# Patient Record
Sex: Female | Born: 1950 | Race: Black or African American | Hispanic: No | Smoking: Never smoker
Health system: Southern US, Community
[De-identification: ages and names within clinical notes are randomized; demographics above are authoritative.]

## PROBLEM LIST (undated history)

## (undated) DIAGNOSIS — M1990 Unspecified osteoarthritis, unspecified site: Secondary | ICD-10-CM

## (undated) DIAGNOSIS — I82409 Acute embolism and thrombosis of unspecified deep veins of unspecified lower extremity: Secondary | ICD-10-CM

## (undated) DIAGNOSIS — K429 Umbilical hernia without obstruction or gangrene: Secondary | ICD-10-CM

## (undated) DIAGNOSIS — H269 Unspecified cataract: Secondary | ICD-10-CM

## (undated) HISTORY — DX: Unspecified osteoarthritis, unspecified site: M19.90

## (undated) HISTORY — DX: Acute embolism and thrombosis of unspecified deep veins of unspecified lower extremity: I82.409

## (undated) HISTORY — DX: Umbilical hernia without obstruction or gangrene: K42.9

## (undated) HISTORY — DX: Unspecified cataract: H26.9

## (undated) HISTORY — PX: NO PAST SURGERIES: SHX2092

---

## 1998-04-01 ENCOUNTER — Encounter: Admission: RE | Admit: 1998-04-01 | Discharge: 1998-04-01 | Payer: Self-pay | Admitting: Family Medicine

## 1998-08-17 ENCOUNTER — Encounter: Admission: RE | Admit: 1998-08-17 | Discharge: 1998-08-17 | Payer: Self-pay | Admitting: Family Medicine

## 1998-11-04 ENCOUNTER — Encounter: Admission: RE | Admit: 1998-11-04 | Discharge: 1998-11-04 | Payer: Self-pay | Admitting: Family Medicine

## 1998-11-21 ENCOUNTER — Encounter: Admission: RE | Admit: 1998-11-21 | Discharge: 1998-11-21 | Payer: Self-pay | Admitting: Sports Medicine

## 1999-01-24 ENCOUNTER — Encounter: Admission: RE | Admit: 1999-01-24 | Discharge: 1999-01-24 | Payer: Self-pay | Admitting: Family Medicine

## 2001-02-24 ENCOUNTER — Encounter: Admission: RE | Admit: 2001-02-24 | Discharge: 2001-02-24 | Payer: Self-pay | Admitting: Family Medicine

## 2001-02-26 ENCOUNTER — Encounter: Admission: RE | Admit: 2001-02-26 | Discharge: 2001-02-26 | Payer: Self-pay | Admitting: Family Medicine

## 2003-08-20 ENCOUNTER — Emergency Department (HOSPITAL_COMMUNITY): Admission: EM | Admit: 2003-08-20 | Discharge: 2003-08-20 | Payer: Self-pay | Admitting: Emergency Medicine

## 2003-08-26 ENCOUNTER — Encounter: Admission: RE | Admit: 2003-08-26 | Discharge: 2003-09-01 | Payer: Self-pay | Admitting: Emergency Medicine

## 2012-07-23 ENCOUNTER — Encounter: Payer: Self-pay | Admitting: *Deleted

## 2012-07-29 ENCOUNTER — Telehealth: Payer: Self-pay | Admitting: *Deleted

## 2012-07-29 NOTE — Telephone Encounter (Signed)
Telephoned patient at home # and left message to call [facility] to reschedule appointment

## 2012-08-05 ENCOUNTER — Ambulatory Visit (HOSPITAL_COMMUNITY): Payer: Self-pay

## 2012-09-02 ENCOUNTER — Other Ambulatory Visit: Payer: Self-pay | Admitting: Obstetrics and Gynecology

## 2012-09-02 DIAGNOSIS — Z1231 Encounter for screening mammogram for malignant neoplasm of breast: Secondary | ICD-10-CM

## 2012-09-03 ENCOUNTER — Encounter (HOSPITAL_COMMUNITY): Payer: Self-pay | Admitting: *Deleted

## 2012-09-09 ENCOUNTER — Other Ambulatory Visit: Payer: Self-pay | Admitting: Obstetrics and Gynecology

## 2012-09-09 ENCOUNTER — Ambulatory Visit (HOSPITAL_COMMUNITY)
Admission: RE | Admit: 2012-09-09 | Discharge: 2012-09-09 | Disposition: A | Discharge status: Home / Self Care | Payer: Self-pay | Source: Ambulatory Visit | Attending: Obstetrics and Gynecology | Admitting: Obstetrics and Gynecology

## 2012-09-09 ENCOUNTER — Other Ambulatory Visit (HOSPITAL_COMMUNITY)
Admission: RE | Admit: 2012-09-09 | Discharge: 2012-09-09 | Disposition: A | Discharge status: Home / Self Care | Payer: Self-pay | Source: Ambulatory Visit | Attending: Obstetrics and Gynecology | Admitting: Obstetrics and Gynecology

## 2012-09-09 ENCOUNTER — Encounter (HOSPITAL_COMMUNITY): Payer: Self-pay

## 2012-09-09 DIAGNOSIS — Z01419 Encounter for gynecological examination (general) (routine) without abnormal findings: Secondary | ICD-10-CM

## 2012-09-09 DIAGNOSIS — Z1231 Encounter for screening mammogram for malignant neoplasm of breast: Secondary | ICD-10-CM

## 2012-09-22 NOTE — Patient Instructions (Signed)
Detailed notes scanned into EPIC under media. 

## 2012-09-22 NOTE — Progress Notes (Signed)
Detailed notes scanned into EPIC under media. 

## 2012-10-01 ENCOUNTER — Encounter (HOSPITAL_COMMUNITY): Payer: Self-pay | Admitting: *Deleted

## 2012-10-03 ENCOUNTER — Encounter (HOSPITAL_COMMUNITY): Payer: Self-pay | Admitting: *Deleted

## 2012-11-12 NOTE — Addendum Note (Signed)
Encounter addended by: Saintclair Halsted, RN on: 11/12/2012  2:44 PM<BR>     Documentation filed: Visit Diagnoses, Charges VN

## 2012-12-04 NOTE — Addendum Note (Signed)
Encounter addended by: Saintclair Halsted, RN on: 12/04/2012  3:23 PM<BR>     Documentation filed: Visit Diagnoses

## 2012-12-04 NOTE — Addendum Note (Signed)
Encounter addended by: Saintclair Halsted, RN on: 12/04/2012  4:03 PM<BR>     Documentation filed: Visit Diagnoses

## 2016-01-26 DIAGNOSIS — M4120 Other idiopathic scoliosis, site unspecified: Secondary | ICD-10-CM | POA: Diagnosis not present

## 2016-01-26 DIAGNOSIS — Z0000 Encounter for general adult medical examination without abnormal findings: Secondary | ICD-10-CM | POA: Diagnosis not present

## 2016-01-26 DIAGNOSIS — Z6823 Body mass index (BMI) 23.0-23.9, adult: Secondary | ICD-10-CM | POA: Diagnosis not present

## 2016-01-26 DIAGNOSIS — E782 Mixed hyperlipidemia: Secondary | ICD-10-CM | POA: Diagnosis not present

## 2016-02-07 ENCOUNTER — Emergency Department (HOSPITAL_COMMUNITY): Payer: Commercial Managed Care - HMO

## 2016-02-07 ENCOUNTER — Encounter (HOSPITAL_COMMUNITY): Payer: Self-pay | Admitting: Emergency Medicine

## 2016-02-07 ENCOUNTER — Emergency Department (HOSPITAL_COMMUNITY)
Admission: EM | Admit: 2016-02-07 | Discharge: 2016-02-07 | Disposition: A | Discharge status: Home / Self Care | Payer: Commercial Managed Care - HMO | Attending: Emergency Medicine | Admitting: Emergency Medicine

## 2016-02-07 DIAGNOSIS — Y998 Other external cause status: Secondary | ICD-10-CM | POA: Insufficient documentation

## 2016-02-07 DIAGNOSIS — Y9389 Activity, other specified: Secondary | ICD-10-CM | POA: Insufficient documentation

## 2016-02-07 DIAGNOSIS — M7581 Other shoulder lesions, right shoulder: Secondary | ICD-10-CM | POA: Diagnosis not present

## 2016-02-07 DIAGNOSIS — S4991XA Unspecified injury of right shoulder and upper arm, initial encounter: Secondary | ICD-10-CM | POA: Diagnosis present

## 2016-02-07 DIAGNOSIS — Y92480 Sidewalk as the place of occurrence of the external cause: Secondary | ICD-10-CM | POA: Diagnosis not present

## 2016-02-07 DIAGNOSIS — M25511 Pain in right shoulder: Secondary | ICD-10-CM | POA: Diagnosis not present

## 2016-02-07 DIAGNOSIS — W010XXA Fall on same level from slipping, tripping and stumbling without subsequent striking against object, initial encounter: Secondary | ICD-10-CM | POA: Insufficient documentation

## 2016-02-07 DIAGNOSIS — M779 Enthesopathy, unspecified: Secondary | ICD-10-CM

## 2016-02-07 MED ORDER — NAPROXEN 500 MG PO TABS: 500.0000 mg | ORAL_TABLET | Freq: Two times a day (BID) | ORAL | Status: DC

## 2016-02-07 NOTE — ED Provider Notes (Signed)
CSN: [id number]     Arrival date & time 02/07/16  0645 History   First MD Initiated Contact with Patient 02/07/16 [phone]     Chief Complaint  Patient presents with  . Fall  . Shoulder Pain      HPI  Patient here for evaluation of shoulder pain. She states that she has been doing some exercises sitting in her chair. She indicates this to me by lacing her fingers in front of her limping both hands above her head and lifting both hands up and down above her head. She states she is not doing this with any sort of weight or resistance or bands. When asked why she is doing that she states "I don't know". Also states she fell on Friday when tripping over a curb and caught herself on her hands. Claims right shoulder is painful onset and it "just drops" when she tries to lift it up. She puts 2 fingers on the front of her anterior glenohumeral joint and states it is painful. No pain posteriorly. No pain down the arm.  History reviewed. No pertinent past medical history. History reviewed. No pertinent past surgical history. No family history on file. Social History  Substance Use Topics  . Smoking status: Never Smoker   . Smokeless tobacco: None  . Alcohol Use: No   OB History    No data available     Review of Systems  Constitutional: Negative for fever, chills, diaphoresis, appetite change and fatigue.  HENT: Negative for mouth sores, sore throat and trouble swallowing.   Eyes: Negative for visual disturbance.  Respiratory: Negative for cough, chest tightness, shortness of breath and wheezing.   Cardiovascular: Negative for chest pain.  Gastrointestinal: Negative for nausea, vomiting, abdominal pain, diarrhea and abdominal distention.  Endocrine: Negative for polydipsia, polyphagia and polyuria.  Genitourinary: Negative for dysuria, frequency and hematuria.  Musculoskeletal: Positive for arthralgias. Negative for gait problem.  Skin: Negative for color change, pallor and rash.    Neurological: Negative for dizziness, syncope, light-headedness and headaches.  Hematological: Does not bruise/bleed easily.  Psychiatric/Behavioral: Negative for behavioral problems and confusion.      Allergies  Review of patient's allergies indicates no known allergies.  Home Medications   Prior to Admission medications   Medication Sig Start Date End Date Taking? Authorizing Provider  naproxen (NAPROSYN) 500 MG tablet Take 1 tablet (500 mg total) by mouth 2 (two) times daily. 02/07/16   Rolland Porter, MD   BP 137/75 mmHg  Pulse 65  Temp(Src) 97.7 F (36.5 C) (Oral)  Resp 18  Ht  (1.651 m)  Wt 139 lb (63.05 kg)  BMI 23.13 kg/m2  SpO2 100% Physical Exam  Constitutional: She is oriented to person, place, and time. She appears well-developed and well-nourished. No distress.  HENT:  Head: Normocephalic.  Eyes: Conjunctivae are normal. Pupils are equal, round, and reactive to light. No scleral icterus.  Neck: Normal range of motion. Neck supple. No thyromegaly present.  Cardiovascular: Normal rate and regular rhythm.  Exam reveals no gallop and no friction rub.   No murmur heard. Pulmonary/Chest: Effort normal and breath sounds normal. No respiratory distress. She has no wheezes. She has no rales.  Abdominal: Soft. Bowel sounds are normal. She exhibits no distension. There is no tenderness. There is no rebound.  Musculoskeletal:       Right shoulder: She exhibits decreased range of motion and tenderness. She exhibits no swelling, no effusion, no crepitus and no deformity.  Arms: Neurological: She is alert and oriented to person, place, and time.  Skin: Skin is warm and dry. No rash noted.  Psychiatric: She has a normal mood and affect. Her behavior is normal.    ED Course  Procedures (including critical care time) Labs Review Labs Reviewed - No data to display  Imaging Review Dg Shoulder Right  02/07/2016  CLINICAL DATA:  Ongoing right shoulder pain since falling  4 days ago. Initial encounter. EXAM: RIGHT SHOULDER - 2+ VIEW COMPARISON:  None. FINDINGS: The bones appear mildly demineralized. There is no evidence of acute fracture or dislocation. The subacromial space appears narrowed, especially on the Y-views, suspicious for a chronic rotator cuff tear. There are mild glenohumeral and acromioclavicular degenerative changes. There is prominent spurring at the humeral tuberosities and acromion. IMPRESSION: No acute osseous findings.  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 Electronically Signed   By: Carey BullocksWilliam  Veazey M.D.   On: 02/07/2016 07:39   I have personally reviewed and evaluated these images and lab results as part of my medical decision-making.   EKG Interpretation None      MDM   Final diagnoses:  Tendonitis  Rotator cuff tendinitis, right    No acute abdomen allergies on x-ray. Symptoms consistent with a rotator cuff tendinitis. Family avoiding of the inciting activities. Intermittent ice. Anti-inflammatory. Primary care follow-up as needed.    Rolland PorterMark Thayer Embleton, MD 02/07/16 [phone]

## 2016-02-07 NOTE — ED Notes (Signed)
Pt had fall on Friday, now c/o right shoulder pain. Tripped over sidewalk. Pt states it is still sore.

## 2016-02-07 NOTE — Discharge Instructions (Signed)
Avoid overhead exercise.  Ice intermittently to the right shoulder.  Naproxen as needed.

## 2016-03-19 DIAGNOSIS — M1990 Unspecified osteoarthritis, unspecified site: Secondary | ICD-10-CM | POA: Diagnosis not present

## 2016-05-01 DIAGNOSIS — H6123 Impacted cerumen, bilateral: Secondary | ICD-10-CM | POA: Diagnosis not present

## 2016-05-01 DIAGNOSIS — M19019 Primary osteoarthritis, unspecified shoulder: Secondary | ICD-10-CM | POA: Diagnosis not present

## 2016-05-01 DIAGNOSIS — S46009S Unspecified injury of muscle(s) and tendon(s) of the rotator cuff of unspecified shoulder, sequela: Secondary | ICD-10-CM | POA: Diagnosis not present

## 2016-06-18 ENCOUNTER — Other Ambulatory Visit: Payer: Self-pay | Admitting: Family Medicine

## 2016-06-18 ENCOUNTER — Encounter: Payer: Self-pay | Admitting: Family Medicine

## 2016-06-18 DIAGNOSIS — Z0000 Encounter for general adult medical examination without abnormal findings: Secondary | ICD-10-CM | POA: Diagnosis not present

## 2016-06-18 DIAGNOSIS — Z23 Encounter for immunization: Secondary | ICD-10-CM | POA: Diagnosis not present

## 2016-06-18 DIAGNOSIS — Z1231 Encounter for screening mammogram for malignant neoplasm of breast: Secondary | ICD-10-CM

## 2016-06-18 DIAGNOSIS — Z1211 Encounter for screening for malignant neoplasm of colon: Secondary | ICD-10-CM | POA: Diagnosis not present

## 2016-06-18 DIAGNOSIS — E2839 Other primary ovarian failure: Secondary | ICD-10-CM

## 2016-06-18 DIAGNOSIS — Z136 Encounter for screening for cardiovascular disorders: Secondary | ICD-10-CM | POA: Diagnosis not present

## 2016-07-17 ENCOUNTER — Ambulatory Visit: Payer: Commercial Managed Care - HMO

## 2016-07-17 ENCOUNTER — Other Ambulatory Visit: Payer: Commercial Managed Care - HMO

## 2016-07-31 ENCOUNTER — Ambulatory Visit
Admission: RE | Admit: 2016-07-31 | Discharge: 2016-07-31 | Disposition: A | Discharge status: Home / Self Care | Payer: Commercial Managed Care - HMO | Source: Ambulatory Visit | Attending: Family Medicine | Admitting: Family Medicine

## 2016-07-31 DIAGNOSIS — Z13820 Encounter for screening for osteoporosis: Secondary | ICD-10-CM | POA: Diagnosis not present

## 2016-07-31 DIAGNOSIS — Z780 Asymptomatic menopausal state: Secondary | ICD-10-CM | POA: Diagnosis not present

## 2016-07-31 DIAGNOSIS — Z1231 Encounter for screening mammogram for malignant neoplasm of breast: Secondary | ICD-10-CM | POA: Diagnosis not present

## 2016-07-31 DIAGNOSIS — E2839 Other primary ovarian failure: Secondary | ICD-10-CM

## 2016-08-02 ENCOUNTER — Other Ambulatory Visit: Payer: Self-pay | Admitting: Family Medicine

## 2016-08-02 DIAGNOSIS — R928 Other abnormal and inconclusive findings on diagnostic imaging of breast: Secondary | ICD-10-CM

## 2017-06-25 DIAGNOSIS — Z1211 Encounter for screening for malignant neoplasm of colon: Secondary | ICD-10-CM | POA: Diagnosis not present

## 2017-06-25 DIAGNOSIS — Z79899 Other long term (current) drug therapy: Secondary | ICD-10-CM | POA: Diagnosis not present

## 2017-06-25 DIAGNOSIS — Z0000 Encounter for general adult medical examination without abnormal findings: Secondary | ICD-10-CM | POA: Diagnosis not present

## 2017-06-25 DIAGNOSIS — Z6822 Body mass index (BMI) 22.0-22.9, adult: Secondary | ICD-10-CM | POA: Diagnosis not present

## 2017-06-25 DIAGNOSIS — Z23 Encounter for immunization: Secondary | ICD-10-CM | POA: Diagnosis not present

## 2017-06-27 ENCOUNTER — Ambulatory Visit: Payer: Commercial Managed Care - HMO

## 2017-06-27 ENCOUNTER — Ambulatory Visit
Admission: RE | Admit: 2017-06-27 | Discharge: 2017-06-27 | Disposition: A | Discharge status: Home / Self Care | Payer: Commercial Managed Care - HMO | Source: Ambulatory Visit | Attending: Family Medicine | Admitting: Family Medicine

## 2017-06-27 DIAGNOSIS — R928 Other abnormal and inconclusive findings on diagnostic imaging of breast: Secondary | ICD-10-CM

## 2017-07-31 ENCOUNTER — Encounter: Payer: Self-pay | Admitting: Family Medicine

## 2017-08-21 DIAGNOSIS — D72818 Other decreased white blood cell count: Secondary | ICD-10-CM | POA: Diagnosis not present

## 2017-08-22 DIAGNOSIS — Z23 Encounter for immunization: Secondary | ICD-10-CM | POA: Diagnosis not present

## 2017-08-22 DIAGNOSIS — Z6822 Body mass index (BMI) 22.0-22.9, adult: Secondary | ICD-10-CM | POA: Diagnosis not present

## 2017-08-22 DIAGNOSIS — D72818 Other decreased white blood cell count: Secondary | ICD-10-CM | POA: Diagnosis not present

## 2017-09-09 ENCOUNTER — Other Ambulatory Visit: Payer: Self-pay | Admitting: Family Medicine

## 2017-09-09 DIAGNOSIS — Z1231 Encounter for screening mammogram for malignant neoplasm of breast: Secondary | ICD-10-CM

## 2017-09-24 ENCOUNTER — Ambulatory Visit
Admission: RE | Admit: 2017-09-24 | Discharge: 2017-09-24 | Disposition: A | Discharge status: Home / Self Care | Payer: Commercial Managed Care - HMO | Source: Ambulatory Visit | Attending: Family Medicine | Admitting: Family Medicine

## 2017-09-24 DIAGNOSIS — Z1231 Encounter for screening mammogram for malignant neoplasm of breast: Secondary | ICD-10-CM

## 2017-12-24 DIAGNOSIS — Z23 Encounter for immunization: Secondary | ICD-10-CM | POA: Diagnosis not present

## 2018-07-01 ENCOUNTER — Other Ambulatory Visit: Payer: Self-pay | Admitting: Family Medicine

## 2018-07-01 DIAGNOSIS — Z0000 Encounter for general adult medical examination without abnormal findings: Secondary | ICD-10-CM | POA: Diagnosis not present

## 2018-07-01 DIAGNOSIS — R1900 Intra-abdominal and pelvic swelling, mass and lump, unspecified site: Secondary | ICD-10-CM | POA: Diagnosis not present

## 2018-07-01 DIAGNOSIS — Z113 Encounter for screening for infections with a predominantly sexual mode of transmission: Secondary | ICD-10-CM | POA: Diagnosis not present

## 2018-07-01 DIAGNOSIS — N760 Acute vaginitis: Secondary | ICD-10-CM | POA: Diagnosis not present

## 2018-07-01 DIAGNOSIS — Z01411 Encounter for gynecological examination (general) (routine) with abnormal findings: Secondary | ICD-10-CM | POA: Diagnosis not present

## 2018-07-01 DIAGNOSIS — Z118 Encounter for screening for other infectious and parasitic diseases: Secondary | ICD-10-CM | POA: Diagnosis not present

## 2018-07-01 DIAGNOSIS — Z01419 Encounter for gynecological examination (general) (routine) without abnormal findings: Secondary | ICD-10-CM | POA: Diagnosis not present

## 2018-07-01 DIAGNOSIS — Z1231 Encounter for screening mammogram for malignant neoplasm of breast: Secondary | ICD-10-CM

## 2018-07-01 DIAGNOSIS — Z6822 Body mass index (BMI) 22.0-22.9, adult: Secondary | ICD-10-CM | POA: Diagnosis not present

## 2018-07-01 DIAGNOSIS — R1903 Right lower quadrant abdominal swelling, mass and lump: Secondary | ICD-10-CM | POA: Diagnosis not present

## 2018-07-01 DIAGNOSIS — Z1151 Encounter for screening for human papillomavirus (HPV): Secondary | ICD-10-CM | POA: Diagnosis not present

## 2018-07-01 DIAGNOSIS — Z1211 Encounter for screening for malignant neoplasm of colon: Secondary | ICD-10-CM | POA: Diagnosis not present

## 2018-07-02 ENCOUNTER — Other Ambulatory Visit: Payer: Self-pay | Admitting: Family Medicine

## 2018-07-02 ENCOUNTER — Ambulatory Visit
Admission: RE | Admit: 2018-07-02 | Discharge: 2018-07-02 | Disposition: A | Discharge status: Home / Self Care | Payer: Commercial Managed Care - HMO | Source: Ambulatory Visit | Attending: Family Medicine | Admitting: Family Medicine

## 2018-07-02 DIAGNOSIS — K449 Diaphragmatic hernia without obstruction or gangrene: Secondary | ICD-10-CM | POA: Diagnosis not present

## 2018-07-02 DIAGNOSIS — R1900 Intra-abdominal and pelvic swelling, mass and lump, unspecified site: Secondary | ICD-10-CM

## 2018-07-23 DIAGNOSIS — K429 Umbilical hernia without obstruction or gangrene: Secondary | ICD-10-CM | POA: Diagnosis not present

## 2018-07-23 DIAGNOSIS — D72819 Decreased white blood cell count, unspecified: Secondary | ICD-10-CM | POA: Diagnosis not present

## 2018-07-23 DIAGNOSIS — Z6821 Body mass index (BMI) 21.0-21.9, adult: Secondary | ICD-10-CM | POA: Diagnosis not present

## 2018-07-23 DIAGNOSIS — R899 Unspecified abnormal finding in specimens from other organs, systems and tissues: Secondary | ICD-10-CM | POA: Diagnosis not present

## 2018-07-23 DIAGNOSIS — Z23 Encounter for immunization: Secondary | ICD-10-CM | POA: Diagnosis not present

## 2018-09-25 ENCOUNTER — Ambulatory Visit: Payer: Commercial Managed Care - HMO

## 2018-10-06 ENCOUNTER — Ambulatory Visit
Admission: RE | Admit: 2018-10-06 | Discharge: 2018-10-06 | Disposition: A | Discharge status: Home / Self Care | Payer: Medicare HMO | Source: Ambulatory Visit | Attending: Family Medicine | Admitting: Family Medicine

## 2018-10-06 DIAGNOSIS — D72818 Other decreased white blood cell count: Secondary | ICD-10-CM | POA: Diagnosis not present

## 2018-10-06 DIAGNOSIS — Z1231 Encounter for screening mammogram for malignant neoplasm of breast: Secondary | ICD-10-CM | POA: Diagnosis not present

## 2018-10-06 DIAGNOSIS — D72819 Decreased white blood cell count, unspecified: Secondary | ICD-10-CM | POA: Diagnosis not present

## 2018-10-07 ENCOUNTER — Ambulatory Visit: Payer: Medicare HMO

## 2018-11-05 ENCOUNTER — Ambulatory Visit: Payer: Medicare HMO

## 2019-07-07 DIAGNOSIS — D72818 Other decreased white blood cell count: Secondary | ICD-10-CM | POA: Diagnosis not present

## 2019-07-07 DIAGNOSIS — M1990 Unspecified osteoarthritis, unspecified site: Secondary | ICD-10-CM | POA: Diagnosis not present

## 2019-07-07 DIAGNOSIS — Z0000 Encounter for general adult medical examination without abnormal findings: Secondary | ICD-10-CM | POA: Diagnosis not present

## 2019-07-07 DIAGNOSIS — K429 Umbilical hernia without obstruction or gangrene: Secondary | ICD-10-CM | POA: Diagnosis not present

## 2019-07-13 DIAGNOSIS — D72818 Other decreased white blood cell count: Secondary | ICD-10-CM | POA: Diagnosis not present

## 2019-07-13 DIAGNOSIS — Z79899 Other long term (current) drug therapy: Secondary | ICD-10-CM | POA: Diagnosis not present

## 2019-08-07 DIAGNOSIS — H40023 Open angle with borderline findings, high risk, bilateral: Secondary | ICD-10-CM | POA: Diagnosis not present

## 2019-08-07 DIAGNOSIS — H5203 Hypermetropia, bilateral: Secondary | ICD-10-CM | POA: Diagnosis not present

## 2019-08-07 DIAGNOSIS — H2513 Age-related nuclear cataract, bilateral: Secondary | ICD-10-CM | POA: Diagnosis not present

## 2019-08-20 DIAGNOSIS — M19019 Primary osteoarthritis, unspecified shoulder: Secondary | ICD-10-CM | POA: Diagnosis not present

## 2019-08-20 DIAGNOSIS — D72819 Decreased white blood cell count, unspecified: Secondary | ICD-10-CM | POA: Diagnosis not present

## 2019-08-20 DIAGNOSIS — H269 Unspecified cataract: Secondary | ICD-10-CM | POA: Diagnosis not present

## 2019-08-25 DIAGNOSIS — Z23 Encounter for immunization: Secondary | ICD-10-CM | POA: Diagnosis not present

## 2019-08-28 ENCOUNTER — Other Ambulatory Visit: Payer: Self-pay | Admitting: Family Medicine

## 2019-08-28 DIAGNOSIS — Z1231 Encounter for screening mammogram for malignant neoplasm of breast: Secondary | ICD-10-CM

## 2019-09-28 ENCOUNTER — Other Ambulatory Visit: Payer: Self-pay | Admitting: *Deleted

## 2019-09-28 DIAGNOSIS — R197 Diarrhea, unspecified: Secondary | ICD-10-CM | POA: Diagnosis not present

## 2019-09-28 DIAGNOSIS — Z20822 Contact with and (suspected) exposure to covid-19: Secondary | ICD-10-CM

## 2019-09-29 DIAGNOSIS — Z719 Counseling, unspecified: Secondary | ICD-10-CM | POA: Diagnosis not present

## 2019-09-29 DIAGNOSIS — R197 Diarrhea, unspecified: Secondary | ICD-10-CM | POA: Diagnosis not present

## 2019-09-29 DIAGNOSIS — R600 Localized edema: Secondary | ICD-10-CM | POA: Diagnosis not present

## 2019-09-29 LAB — NOVEL CORONAVIRUS, NAA: SARS-CoV-2, NAA: NOT DETECTED

## 2019-10-05 ENCOUNTER — Encounter (HOSPITAL_COMMUNITY): Payer: Self-pay | Admitting: Emergency Medicine

## 2019-10-05 ENCOUNTER — Other Ambulatory Visit: Payer: Self-pay

## 2019-10-05 ENCOUNTER — Emergency Department (HOSPITAL_BASED_OUTPATIENT_CLINIC_OR_DEPARTMENT_OTHER): Payer: Medicare HMO

## 2019-10-05 ENCOUNTER — Inpatient Hospital Stay (HOSPITAL_COMMUNITY)
Admission: EM | Admit: 2019-10-05 | Discharge: 2019-10-07 | DRG: 301 | Disposition: A | Discharge status: Home / Self Care | Payer: Medicare HMO | Attending: Internal Medicine | Admitting: Internal Medicine

## 2019-10-05 ENCOUNTER — Emergency Department (HOSPITAL_COMMUNITY): Payer: Medicare HMO

## 2019-10-05 DIAGNOSIS — K0889 Other specified disorders of teeth and supporting structures: Secondary | ICD-10-CM | POA: Diagnosis present

## 2019-10-05 DIAGNOSIS — R609 Edema, unspecified: Secondary | ICD-10-CM | POA: Diagnosis not present

## 2019-10-05 DIAGNOSIS — R600 Localized edema: Secondary | ICD-10-CM | POA: Diagnosis not present

## 2019-10-05 DIAGNOSIS — I82442 Acute embolism and thrombosis of left tibial vein: Secondary | ICD-10-CM | POA: Diagnosis not present

## 2019-10-05 DIAGNOSIS — I82452 Acute embolism and thrombosis of left peroneal vein: Secondary | ICD-10-CM | POA: Diagnosis not present

## 2019-10-05 DIAGNOSIS — I82432 Acute embolism and thrombosis of left popliteal vein: Secondary | ICD-10-CM

## 2019-10-05 DIAGNOSIS — Z6821 Body mass index (BMI) 21.0-21.9, adult: Secondary | ICD-10-CM | POA: Diagnosis not present

## 2019-10-05 DIAGNOSIS — I82412 Acute embolism and thrombosis of left femoral vein: Secondary | ICD-10-CM | POA: Diagnosis not present

## 2019-10-05 DIAGNOSIS — I82409 Acute embolism and thrombosis of unspecified deep veins of unspecified lower extremity: Secondary | ICD-10-CM | POA: Diagnosis present

## 2019-10-05 DIAGNOSIS — R000 Tachycardia, unspecified: Secondary | ICD-10-CM | POA: Diagnosis not present

## 2019-10-05 DIAGNOSIS — Z03818 Encounter for observation for suspected exposure to other biological agents ruled out: Secondary | ICD-10-CM | POA: Diagnosis not present

## 2019-10-05 DIAGNOSIS — I824Y2 Acute embolism and thrombosis of unspecified deep veins of left proximal lower extremity: Secondary | ICD-10-CM | POA: Diagnosis not present

## 2019-10-05 DIAGNOSIS — Z20828 Contact with and (suspected) exposure to other viral communicable diseases: Secondary | ICD-10-CM | POA: Diagnosis present

## 2019-10-05 DIAGNOSIS — I82462 Acute embolism and thrombosis of left calf muscular vein: Secondary | ICD-10-CM | POA: Diagnosis not present

## 2019-10-05 DIAGNOSIS — I82402 Acute embolism and thrombosis of unspecified deep veins of left lower extremity: Secondary | ICD-10-CM | POA: Diagnosis not present

## 2019-10-05 DIAGNOSIS — M79605 Pain in left leg: Secondary | ICD-10-CM | POA: Diagnosis present

## 2019-10-05 DIAGNOSIS — E876 Hypokalemia: Secondary | ICD-10-CM | POA: Diagnosis present

## 2019-10-05 DIAGNOSIS — I82422 Acute embolism and thrombosis of left iliac vein: Principal | ICD-10-CM | POA: Diagnosis present

## 2019-10-05 DIAGNOSIS — R634 Abnormal weight loss: Secondary | ICD-10-CM | POA: Diagnosis present

## 2019-10-05 DIAGNOSIS — R001 Bradycardia, unspecified: Secondary | ICD-10-CM | POA: Diagnosis present

## 2019-10-05 DIAGNOSIS — K439 Ventral hernia without obstruction or gangrene: Secondary | ICD-10-CM | POA: Diagnosis not present

## 2019-10-05 DIAGNOSIS — R197 Diarrhea, unspecified: Secondary | ICD-10-CM | POA: Diagnosis not present

## 2019-10-05 DIAGNOSIS — Z719 Counseling, unspecified: Secondary | ICD-10-CM | POA: Diagnosis not present

## 2019-10-05 LAB — CBC WITH DIFFERENTIAL/PLATELET
Abs Immature Granulocytes: 0.03 10*3/uL (ref 0.00–0.07)
Basophils Absolute: 0 10*3/uL (ref 0.0–0.1)
Basophils Relative: 1 %
Eosinophils Absolute: 0.3 10*3/uL (ref 0.0–0.5)
Eosinophils Relative: 8 %
HCT: 36.1 % (ref 36.0–46.0)
Hemoglobin: 11.5 g/dL — ABNORMAL LOW (ref 12.0–15.0)
Immature Granulocytes: 1 %
Lymphocytes Relative: 26 %
Lymphs Abs: 1.1 10*3/uL (ref 0.7–4.0)
MCH: 29 pg (ref 26.0–34.0)
MCHC: 31.9 g/dL (ref 30.0–36.0)
MCV: 90.9 fL (ref 80.0–100.0)
Monocytes Absolute: 0.5 10*3/uL (ref 0.1–1.0)
Monocytes Relative: 11 %
Neutro Abs: 2.2 10*3/uL (ref 1.7–7.7)
Neutrophils Relative %: 53 %
Platelets: 297 10*3/uL (ref 150–400)
RBC: 3.97 MIL/uL (ref 3.87–5.11)
RDW: 15.9 % — ABNORMAL HIGH (ref 11.5–15.5)
WBC: 4.1 10*3/uL (ref 4.0–10.5)
nRBC: 0 % (ref 0.0–0.2)

## 2019-10-05 LAB — COMPREHENSIVE METABOLIC PANEL
ALT: 46 U/L — ABNORMAL HIGH (ref 0–44)
AST: 47 U/L — ABNORMAL HIGH (ref 15–41)
Albumin: 2.2 g/dL — ABNORMAL LOW (ref 3.5–5.0)
Alkaline Phosphatase: 72 U/L (ref 38–126)
Anion gap: 12 (ref 5–15)
BUN: <5 mg/dL — ABNORMAL LOW (ref 8–23)
CO2: 37 mmol/L — ABNORMAL HIGH (ref 22–32)
Calcium: 8.5 mg/dL — ABNORMAL LOW (ref 8.9–10.3)
Chloride: 94 mmol/L — ABNORMAL LOW (ref 98–111)
Creatinine, Ser: 0.66 mg/dL (ref 0.44–1.00)
GFR calc Af Amer: >60 mL/min (ref >60)
GFR calc non Af Amer: >60 mL/min (ref >60)
Glucose, Bld: 118 mg/dL — ABNORMAL HIGH (ref 70–99)
Potassium: <2 mmol/L — CL (ref 3.5–5.1)
Sodium: 143 mmol/L (ref 135–145)
Total Bilirubin: 1 mg/dL (ref 0.3–1.2)
Total Protein: 6 g/dL — ABNORMAL LOW (ref 6.5–8.1)

## 2019-10-05 LAB — MAGNESIUM: Magnesium: 2.2 mg/dL (ref 1.7–2.4)

## 2019-10-05 LAB — BASIC METABOLIC PANEL
Anion gap: 8 (ref 5–15)
BUN: <5 mg/dL — ABNORMAL LOW (ref 8–23)
CO2: 37 mmol/L — ABNORMAL HIGH (ref 22–32)
Calcium: 8.1 mg/dL — ABNORMAL LOW (ref 8.9–10.3)
Chloride: 98 mmol/L (ref 98–111)
Creatinine, Ser: 0.65 mg/dL (ref 0.44–1.00)
GFR calc Af Amer: >60 mL/min (ref >60)
GFR calc non Af Amer: >60 mL/min (ref >60)
Glucose, Bld: 104 mg/dL — ABNORMAL HIGH (ref 70–99)
Potassium: <2 mmol/L — CL (ref 3.5–5.1)
Sodium: 143 mmol/L (ref 135–145)

## 2019-10-05 LAB — PROTIME-INR
INR: 1.1 (ref 0.8–1.2)
Prothrombin Time: 13.8 seconds (ref 11.4–15.2)

## 2019-10-05 LAB — HEPARIN LEVEL (UNFRACTIONATED): Heparin Unfractionated: 0.45 IU/mL (ref 0.30–0.70)

## 2019-10-05 LAB — APTT: aPTT: 27 seconds (ref 24–36)

## 2019-10-05 LAB — PHOSPHORUS: Phosphorus: 2.7 mg/dL (ref 2.5–4.6)

## 2019-10-05 LAB — SARS CORONAVIRUS 2 (TAT 6-24 HRS): SARS Coronavirus 2: NEGATIVE

## 2019-10-05 LAB — LACTATE DEHYDROGENASE: LDH: 312 U/L — ABNORMAL HIGH (ref 98–192)

## 2019-10-05 MED ORDER — ONDANSETRON HCL 4 MG/2ML IJ SOLN: 4.0000 mg | Freq: Four times a day (QID) | INTRAMUSCULAR | Status: DC | PRN

## 2019-10-05 MED ORDER — POTASSIUM CHLORIDE CRYS ER 20 MEQ PO TBCR
40.0000 meq | EXTENDED_RELEASE_TABLET | ORAL | Status: AC
  Administered 2019-10-05 – 2019-10-06 (×3): 40 meq via ORAL
  Filled 2019-10-05 (×3): qty 2

## 2019-10-05 MED ORDER — ACETAMINOPHEN 325 MG PO TABS: 650.0000 mg | ORAL_TABLET | Freq: Four times a day (QID) | ORAL | Status: DC | PRN

## 2019-10-05 MED ORDER — IOHEXOL 350 MG/ML SOLN
100.0000 mL | Freq: Once | INTRAVENOUS | Status: AC | PRN
  Administered 2019-10-05: 19:00:00 100 mL via INTRAVENOUS

## 2019-10-05 MED ORDER — SODIUM CHLORIDE 0.9 % IV SOLN
INTRAVENOUS | Status: DC
  Administered 2019-10-05: 22:00:00 via INTRAVENOUS

## 2019-10-05 MED ORDER — ACETAMINOPHEN 650 MG RE SUPP: 650.0000 mg | Freq: Four times a day (QID) | RECTAL | Status: DC | PRN

## 2019-10-05 MED ORDER — HEPARIN BOLUS VIA INFUSION
3500.0000 [IU] | Freq: Once | INTRAVENOUS | Status: AC
  Administered 2019-10-05: 16:00:00 3500 [IU] via INTRAVENOUS
  Filled 2019-10-05: qty 3500

## 2019-10-05 MED ORDER — POTASSIUM CHLORIDE 2 MEQ/ML IV SOLN
INTRAVENOUS | Status: DC
  Administered 2019-10-06 – 2019-10-07 (×3): via INTRAVENOUS
  Filled 2019-10-05 (×5): qty 1000

## 2019-10-05 MED ORDER — ONDANSETRON HCL 4 MG PO TABS: 4.0000 mg | ORAL_TABLET | Freq: Four times a day (QID) | ORAL | Status: DC | PRN

## 2019-10-05 MED ORDER — HEPARIN (PORCINE) 25000 UT/250ML-% IV SOLN
1000.0000 [IU]/h | INTRAVENOUS | Status: AC
Start: 2019-10-05 — End: 2019-10-07
  Administered 2019-10-05: 1000 [IU]/h via INTRAVENOUS
  Filled 2019-10-05 (×2): qty 250

## 2019-10-05 MED ORDER — HYDROCODONE-ACETAMINOPHEN 5-325 MG PO TABS: 1.0000 | ORAL_TABLET | ORAL | Status: DC | PRN

## 2019-10-05 NOTE — Consult Note (Signed)
Chief Complaint: Patient was seen in consultation today for LLE DVT  Referring Physician(s): Dr. Jeraldine LootsLockwood  Supervising Physician: Irish LackYamagata, Glenn  [facility]  History of Present Illness: Jenna NorfolkClementine Kelley is a 68 y.o. female with no significant past medical history presented to the ED today with LLE swelling.  She reports the symptoms started on Friday.  Denies inciting event or trauma.  States she awoke without swelling, however noticed worsening over the course of the day.  She denies pain or inability to bear weight, but does state she was using a cane to "be gentle with it."  When her leg was not improved today (possibly worse per her sister's report), her sister brought her to the ED.   IR consulted for DVT lysis.  Preliminary report for LLE doppler shows: Findings consistent with acute deep vein thrombosis involving the left external iliac vein, common femoral vein, left femoral vein, left popliteal vein, left posterior tibial veins, left peroneal veins, and left gastrocnemius veins. Uanble to  visualize common iliac vein.  IR consulted for DVT lysis at the request of Dr. Jeraldine LootsLockwood.   History reviewed. No pertinent past medical history.  History reviewed. No pertinent surgical history.  Allergies: Patient has no known allergies.  Medications: Prior to Admission medications   Medication Sig Start Date End Date Taking? Authorizing Provider  Multiple Vitamin (MULTIVITAMIN WITH MINERALS) TABS tablet Take 1 tablet by mouth daily.   Yes [provider]  ciprofloxacin (CIPRO) 500 MG tablet Take 500 mg by mouth 2 (two) times daily. 09/28/19   [provider]     Family History  Problem Relation Age of Onset  . Breast cancer Neg Hx     Social History   Socioeconomic History  . Marital status: Single    Spouse name: Not on file  . Number of children: Not on file  . Years of education: Not on file  . Highest education level: Not on file   Occupational History  . Not on file  Social Needs  . Financial resource strain: Not on file  . Food insecurity    Worry: Not on file    Inability: Not on file  . Transportation needs    Medical: Not on file    Non-medical: Not on file  Tobacco Use  . Smoking status: Never Smoker  Substance and Sexual Activity  . Alcohol use: No  . Drug use: Not on file  . Sexual activity: Not on file  Lifestyle  . Physical activity    Days per week: Not on file    Minutes per session: Not on file  . Stress: Not on file  Relationships  . Social Musicianconnections    Talks on phone: Not on file    Gets together: Not on file    Attends religious service: Not on file    Active member of club or organization: Not on file    Attends meetings of clubs or organizations: Not on file    Relationship status: Not on file  Other Topics Concern  . Not on file  Social History Narrative  . Not on file     Review of Systems: A 12 point ROS discussed and pertinent positives are indicated in the HPI above.  All other systems are negative.  Review of Systems  Constitutional: Negative for fatigue and fever.  Respiratory: Negative for cough and shortness of breath.   Cardiovascular: Negative for chest pain.  Gastrointestinal: Negative for abdominal pain.  Musculoskeletal: Positive for gait problem. Negative for back pain.       Leg swelling.  Psychiatric/Behavioral: Negative for behavioral problems and confusion.    Vital Signs: BP (!) 110/53 (BP Location: Right Arm)   Pulse 68   Temp 98.1 F (36.7 C) (Oral)   Resp 14   Ht 5\' 6"  (1.676 m)   Wt 134 lb (60.8 kg)   SpO2 100%   BMI 21.63 kg/m   Physical Exam Vitals signs and nursing note reviewed.  Constitutional:      General: She is not in acute distress.    Appearance: Normal appearance. She is not ill-appearing.     Comments: Evidence of subcutaneous fat and muscle wasting.   HENT:     Mouth/Throat:     Mouth: Mucous membranes are moist.      Pharynx: Oropharynx is clear.  Cardiovascular:     Rate and Rhythm: Normal rate and regular rhythm.     Heart sounds: No murmur.  Pulmonary:     Effort: Pulmonary effort is normal. No respiratory distress.     Breath sounds: Normal breath sounds.  Musculoskeletal: Normal range of motion.        General: Swelling (LLE) present. No tenderness.     Comments: LLE swollen from foot to thigh.  Non-tender to palpation. Her foot with significant 4+ pitting edema.  Erythema and warmth are present over the shin, calf, and thigh.  Skin:    General: Skin is warm and dry.  Neurological:     General: No focal deficit present.     Mental Status: She is alert and oriented to person, place, and time. Mental status is at baseline.  Psychiatric:        Mood and Affect: Mood normal.        Behavior: Behavior normal.        Thought Content: Thought content normal.        Judgment: Judgment normal.      MD Evaluation Airway: WNL Heart: WNL Abdomen: WNL Chest/ Lungs: WNL ASA  Classification: 3 Mallampati/Airway Score: Two   Imaging: Vas [place] Lower Extremity Venous (dvt) (only Mc & Wl)  Result Date: 10/05/2019  Lower Venous Study Indications: Edema.  Limitations: Abdmonial gas and body habitus. Comparison Study: No prior study on file for comparison Performing Technologist: Sharion Dove RVS  Examination Guidelines: A complete evaluation includes B-mode imaging, spectral Doppler, color Doppler, and power Doppler as needed of all accessible portions of each vessel. Bilateral testing is considered an integral part of a complete examination. Limited examinations for reoccurring indications may be performed as noted.  +-----+---------------+---------+-----------+----------+--------------+ RIGHTCompressibilityPhasicitySpontaneityPropertiesThrombus Aging +-----+---------------+---------+-----------+----------+--------------+ CFV  Full           Yes      Yes                                  +-----+---------------+---------+-----------+----------+--------------+   +---------+---------------+---------+-----------+----------+--------------+ LEFT     CompressibilityPhasicitySpontaneityPropertiesThrombus Aging +---------+---------------+---------+-----------+----------+--------------+ CFV      None           No       No                   Acute          +---------+---------------+---------+-----------+----------+--------------+ SFJ      None  Acute          +---------+---------------+---------+-----------+----------+--------------+ FV Prox  None                                         Acute          +---------+---------------+---------+-----------+----------+--------------+ FV Mid   None                                         Acute          +---------+---------------+---------+-----------+----------+--------------+ FV DistalNone                                         Acute          +---------+---------------+---------+-----------+----------+--------------+ PFV      Full                                                        +---------+---------------+---------+-----------+----------+--------------+ POP      None           No       No                   Acute          +---------+---------------+---------+-----------+----------+--------------+ PTV      None                                         Acute          +---------+---------------+---------+-----------+----------+--------------+ PERO     None                                         Acute          +---------+---------------+---------+-----------+----------+--------------+ Gastroc  None                                         Acute          +---------+---------------+---------+-----------+----------+--------------+ EIV      None                                         Acute           +---------+---------------+---------+-----------+----------+--------------+ CIV                                                   Not visualized +---------+---------------+---------+-----------+----------+--------------+     Summary: Right: No evidence of common femoral vein obstruction. Left: Findings consistent with acute deep vein thrombosis involving the left external iliac  vein, common femoral vein, left femoral vein, left popliteal vein, left posterior tibial veins, left peroneal veins, and left gastrocnemius veins. Uanble to visualize common iliac vein.  *See table(s) above for measurements and observations.    Preliminary     Labs:  CBC: No results for input(s): WBC, HGB, HCT, PLT in the last 8760 hours.  COAGS: No results for input(s): INR, APTT in the last 8760 hours.  BMP: No results for input(s): NA, K, CL, CO2, GLUCOSE, BUN, CALCIUM, CREATININE, GFRNONAA, GFRAA in the last 8760 hours.  Invalid input(s): CMP  LIVER FUNCTION TESTS: No results for input(s): BILITOT, AST, ALT, ALKPHOS, PROT, ALBUMIN in the last 8760 hours.  TUMOR MARKERS: No results for input(s): AFPTM, CEA, CA199, CHROMGRNA in the last 8760 hours.  Assessment and Plan: LLE DVT Patient with swelling of the left lower extremity Preliminary doppler report shows extensive DVT in the left leg extending from external iliac to gastronemius.  Patient without history of DVT.  No past medical history. No inciting event. Appears unprovoked at this point.  Question possible recent weight loss based on physical exam.   She has no shortness of breath or PE. Discussed lysis with patient and sister.  She would be agreeable to intervention if needed.  Plans to start IV heparin overnight.   Reviewed with Dr. Fredia Sorrow.   Labs are pending at this time.  CBC, BMP have been ordered.  No known history of malignancy.  Continue heparin for now.  Once labs return, proceed with CT Venogram. IR to follow imaging, lab work,  and any additional clinical findings to determine candidacy for lysis procedure.    Thank you for this interesting consult.  I greatly enjoyed meeting [place] and look forward to participating in their care.  A copy of this report was sent to the requesting provider on this date.  Electronically Signed: Hoyt Koch, PA 10/05/2019, 3:28 PM   I spent a total of 40 Minutes    in face to face in clinical consultation, greater than 50% of which was counseling/coordinating care for LLE DVT.

## 2019-10-05 NOTE — ED Notes (Signed)
Vascular at bedside

## 2019-10-05 NOTE — H&P (Signed)
Jenna Kelley ZOX:[id number] DOB: [birth year] DOA: 10/05/2019     PCP: Lewis Moccasin, MD   Outpatient Specialists: NONE    Patient arrived to ER on 10/05/19 at 1100  Patient coming from: home Lives alone,       Chief Complaint:   Chief Complaint  Patient presents with   Leg Swelling    HPI:  Jenna Kelley is a 68 y.o. female with medical history significant of supraumbelical Hernia    Presented with left leg swelling started 4 days ago she is not sure how that this occurred.  She woke up with the swelling.  And is progressed throughout the day.  She is able to bear weight but has to use cane because it is hurts somewhat.  Swelling has not improved and her sister brought her to the emergency department.  Patient states years ago she was told she has some sort of growth in her abdomen ever since she has been "saved" she has been saying daily prayers and have never needed any medical care since then.  She does not take any medication.  She is unsure if she ever had a colonoscopy.  But she have had regular mammograms states although during the last 1 may have found something she does not really believe so.  Patient used to drink alcohol but that was very remote.  No alcohol abuse currently no tobacco abuse.  She has lost a lot of weight unsure how much.   Infectious risk factors:  Reports none  In  ER RAPID COVID TEST NEGATIVE   Lab Results  Component Value Date   SARSCOV2NAA NEGATIVE 10/05/2019   SARSCOV2NAA Not Detected 09/28/2019     Regarding pertinent Chronic problems:   none   While in ER: Found to have large left lower extremity DVT Interventional radiology was consulted  Plan to admit for DVT lysis in the morning  Continue with IV heparin  The following Work up has been ordered so far:  Orders Placed This Encounter  Procedures   SARS CORONAVIRUS 2 (TAT 6-24 HRS) Nasopharyngeal Nasopharyngeal Swab   IR Radiologist Eval & Mgmt   CT  VENOGRAM ABD/PEL   CBC   Heparin level (unfractionated)   APTT   Protime-INR   Heparin level (unfractionated)   Basic metabolic panel   Diet NPO time specified Except for: Sips with Meds   Place patient on VTE Treatment Pathway.   Consult to hospitalist  ALL PATIENTS BEING ADMITTED/HAVING PROCEDURES NEED COVID-19 SCREENING   Consult to care management   heparin per pharmacy consult   Consult to hospitalist  ALL PATIENTS BEING ADMITTED/HAVING PROCEDURES NEED COVID-19 SCREENING   VAS [place] LOWER EXTREMITY VENOUS (DVT) (ONLY [facility])     Following Medications were ordered in ER: Medications  heparin ADULT infusion 100 units/mL (25000 units/25mL sodium chloride 0.45%) (1,000 Units/hr Intravenous New Bag/Given 10/05/19 1535)  heparin bolus via infusion 3,500 Units (3,500 Units Intravenous Bolus from Bag 10/05/19 1535)        Consult Orders  (From admission, onward)         Start     Ordered   10/05/19 1703  Consult to hospitalist  ALL PATIENTS BEING ADMITTED/HAVING PROCEDURES NEED COVID-19 SCREENING  Once    Comments: ALL PATIENTS BEING ADMITTED/HAVING PROCEDURES NEED COVID-19 SCREENING  Provider:  (Not yet assigned)  Question Answer Comment  Place call to: [facility]   Reason for Consult Admit      10/05/19 1702  10/05/19 1331  Consult to care management  Once    Comments: Discharge planning on enoxaparin (Lovenox)  Provider:  (Not yet assigned)  Question:  Reason for consult:  Answer:  Other (see comments)   10/05/19 1332   10/05/19 1327  Consult to hospitalist  ALL PATIENTS BEING ADMITTED/HAVING PROCEDURES NEED COVID-19 SCREENING  Once    Comments: ALL PATIENTS BEING ADMITTED/HAVING PROCEDURES NEED COVID-19 SCREENING  Provider:  (Not yet assigned)  Question Answer Comment  Place call to: [facility]   Reason for Consult Admit      10/05/19 1326          ER Provider Called:  IR    Dr Fredia Sorrow They Recommend admit to medicine     seen      in ER   Significant initial  Findings: Abnormal Labs Reviewed  BASIC METABOLIC PANEL - Abnormal; Notable for the following components:      Result Value   Potassium <2.0 (*)    CO2 37 (*)    Glucose, Bld 104 (*)    BUN <5 (*)    Calcium 8.1 (*)    All other components within normal limits  CBC WITH DIFFERENTIAL/PLATELET - Abnormal; Notable for the following components:   Hemoglobin 11.5 (*)    RDW 15.9 (*)    All other components within normal limits     Otherwise labs showing:    Recent Labs  Lab 10/05/19 1700  NA 143  K <2.0*  CO2 37*  GLUCOSE 104*  BUN <5*  CREATININE 0.65  CALCIUM 8.1*    Cr    Stable,  Lab Results  Component Value Date   CREATININE 0.65 10/05/2019    No results for input(s): AST, ALT, ALKPHOS, BILITOT, PROT, ALBUMIN in the last 168 hours. Lab Results  Component Value Date   CALCIUM 8.1 (L) 10/05/2019     WBC       Component Value Date/Time   WBC 4.1 10/05/2019 2216   ANC    Component Value Date/Time   NEUTROABS 2.2 10/05/2019 2216     Plt: Lab Results  Component Value Date   PLT 297 10/05/2019     Lactic Acid, Venous No results found for: LATICACIDVEN     COVID-19 Labs  No results for input(s): DDIMER, FERRITIN, LDH, CRP in the last 72 hours.  Lab Results  Component Value Date   SARSCOV2NAA NEGATIVE 10/05/2019   SARSCOV2NAA Not Detected 09/28/2019      HG/HCT stable,       Component Value Date/Time   HGB 11.5 (L) 10/05/2019 2216   HCT 36.1 10/05/2019 2216       ECG: Ordered   BNP (last 3 results) No results for input(s): BNP in the last 8760 hours.  ProBNP (last 3 results) No results for input(s): PROBNP in the last 8760 hours.  DM  labs:  HbA1C: No results for input(s): HGBA1C in the last 8760 hours.     CBG (last 3)  No results for input(s): GLUCAP in the last 72 hours.     UA  ordered      Ordered  Doppler LEft leg DVT Venogram ordered currently pending   ED Triage Vitals  Enc Vitals  Group     BP 10/05/19 1106 (!) 110/53     Pulse Rate 10/05/19 1106 68     Resp 10/05/19 1106 14     Temp 10/05/19 1106 98.1 F (36.7 C)     Temp Source 10/05/19 1106  Oral     SpO2 10/05/19 1106 100 %     Weight 10/05/19 1115 134 lb (60.8 kg)     Height 10/05/19 1115  (1.676 m)     Head Circumference --      Peak Flow --      Pain Score 10/05/19 1115 0     Pain Loc --      Pain Edu? --      Excl. in GC? --   TMAX(24)@       Latest  Blood pressure (!) 111/49, pulse 62, temperature 98.1 F (36.7 C), temperature source Oral, resp. rate 14, height  (1.676 m), weight 60.8 kg, SpO2 100 %.     Hospitalist was called for admission for massive DVT   Review of Systems:    Pertinent positives include: leg pain  Constitutional:  No weight loss, night sweats, Fevers, chills, fatigue, weight loss  HEENT:  No headaches, Difficulty swallowing,Tooth/dental problems,Sore throat,  No sneezing, itching, ear ache, nasal congestion, post nasal drip,  Cardio-vascular:  No chest pain, Orthopnea, PND, anasarca, dizziness, palpitations.no Bilateral lower extremity swelling  GI:  No heartburn, indigestion, abdominal pain, nausea, vomiting, diarrhea, change in bowel habits, loss of appetite, melena, blood in stool, hematemesis Resp:  no shortness of breath at rest. No dyspnea on exertion, No excess mucus, no productive cough, No non-productive cough, No coughing up of blood.No change in color of mucus.No wheezing. Skin:  no rash or lesions. No jaundice GU:  no dysuria, change in color of urine, no urgency or frequency. No straining to urinate.  No flank pain.  Musculoskeletal:  No joint pain or no joint swelling. No decreased range of motion. No back pain.  Psych:  No change in mood or affect. No depression or anxiety. No memory loss.  Neuro: no localizing neurological complaints, no tingling, no weakness, no double vision, no gait abnormality, no slurred speech, no confusion  All  systems reviewed and apart from HOPI all are negative  Past Medical History:  History reviewed. No pertinent past medical history.    History reviewed. No pertinent surgical history.  Social History:  Ambulatory  independently       reports that she has never smoked. She does not have any smokeless tobacco history on file. She reports that she does not drink alcohol. No history on file for drug.     Family History:   Family History  Problem Relation Age of Onset   Breast cancer Neg Hx     Allergies: No Known Allergies   Prior to Admission medications   Medication Sig Start Date End Date Taking? Authorizing Provider  Multiple Vitamin (MULTIVITAMIN WITH MINERALS) TABS tablet Take 1 tablet by mouth daily.   Yes [provider]  ciprofloxacin (CIPRO) 500 MG tablet Take 500 mg by mouth 2 (two) times daily. 09/28/19   [provider]   Physical Exam: Blood pressure (!) 111/49, pulse 62, temperature 98.1 F (36.7 C), temperature source Oral, resp. rate 14, height  (1.676 m), weight 60.8 kg, SpO2 100 %. 1. General:  in No Acute distress   Chronically ill  -appearing thin 2. Psychological: Alert and  Oriented 3. Head/ENT:  Dry Mucous Membranes                          Head Non traumatic, neck supple  Poor Dentition 4. SKIN:   decreased Skin turgor,  Skin clean Dry and intact no rash 5. Heart: Regular rate and rhythm no  Murmur, no Rub or gallop 6. Lungs:  no wheezes or crackles   7. Abdomen: Soft,  non-tender, Non distended  bowel sounds present large ventral hernia present 8. Lower extremities: no clubbing, cyanosis, severe left leg edema 9. Neurologically Grossly intact, moving all 4 extremities equally  10. MSK: Normal range of motion   All other LABS:    No results for input(s): WBC, NEUTROABS, HGB, HCT, MCV, PLT in the last 168 hours.   Recent Labs  Lab 10/05/19 1700  NA 143  K <2.0*  CL 98  CO2 37*  GLUCOSE  104*  BUN <5*  CREATININE 0.65  CALCIUM 8.1*     No results for input(s): AST, ALT, ALKPHOS, BILITOT, PROT, ALBUMIN in the last 168 hours.     Cultures: No results found for: SDES, SPECREQUEST, CULT, REPTSTATUS   Radiological Exams on Admission: Vas [place] Lower Extremity Venous (dvt) (only [facility])  Result Date: 10/05/2019  Lower Venous Study Indications: Edema.  Limitations: Abdmonial gas and body habitus. Comparison Study: No prior study on file for comparison Performing Technologist: Sherren Kernsandace Kanady RVS  Examination Guidelines: A complete evaluation includes B-mode imaging, spectral Doppler, color Doppler, and power Doppler as needed of all accessible portions of each vessel. Bilateral testing is considered an integral part of a complete examination. Limited examinations for reoccurring indications may be performed as noted.  +-----+---------------+---------+-----------+----------+--------------+  RIGHT Compressibility Phasicity Spontaneity Properties Thrombus Aging  +-----+---------------+---------+-----------+----------+--------------+  CFV   Full            Yes       Yes                                    +-----+---------------+---------+-----------+----------+--------------+   +---------+---------------+---------+-----------+----------+--------------+  LEFT      Compressibility Phasicity Spontaneity Properties Thrombus Aging  +---------+---------------+---------+-----------+----------+--------------+  CFV       None            No        No                     Acute           +---------+---------------+---------+-----------+----------+--------------+  SFJ       None                                             Acute           +---------+---------------+---------+-----------+----------+--------------+  FV Prox   None                                             Acute           +---------+---------------+---------+-----------+----------+--------------+  FV Mid    None                                              Acute           +---------+---------------+---------+-----------+----------+--------------+  FV Distal None                                             Acute           +---------+---------------+---------+-----------+----------+--------------+  PFV       Full                                                             +---------+---------------+---------+-----------+----------+--------------+  POP       None            No        No                     Acute           +---------+---------------+---------+-----------+----------+--------------+  PTV       None                                             Acute           +---------+---------------+---------+-----------+----------+--------------+  PERO      None                                             Acute           +---------+---------------+---------+-----------+----------+--------------+  Gastroc   None                                             Acute           +---------+---------------+---------+-----------+----------+--------------+  EIV       None                                             Acute           +---------+---------------+---------+-----------+----------+--------------+  CIV                                                        Not visualized  +---------+---------------+---------+-----------+----------+--------------+     Summary: Right: No evidence of common femoral vein obstruction. Left: Findings consistent with acute deep vein thrombosis involving the left external iliac vein, common femoral vein, left femoral vein, left popliteal vein, left posterior tibial veins, left peroneal veins, and left gastrocnemius veins. Uanble to visualize common iliac vein.  *See table(s) above for measurements and observations.    Preliminary     Chart has been reviewed    Assessment/Plan  68 y.o. female with medical history significant of supraumbelical Hernia Admitted for Large DVT  Present  on Admission:  DVT (deep venous thrombosis) (Jenna Kelley)  - with large blood clot burden requiring thrombolysis by IR  Appreciate their input Continue heparin drip    Hypokalemia -we will repeat check magnesium level replace as needed  Weight loss will need further work-up for malignancy part of it can be done as an outpatient   Other plan as per orders.  DVT prophylaxis:  heparin   Code Status:  FULL CODE  as per patient    I had personally discussed CODE STATUS with patient    Family Communication:   Family not at  Bedside   Disposition Plan:      To home once workup is complete and patient is stable                                      Consults called: IR     Admission status:  ED Disposition    ED Disposition Condition Comment   Admit  The patient appears reasonably stabilized for admission considering the current resources, flow, and capabilities available in the ED at this time, and I doubt any other [facility] requiring further screening and/or treatment in the ED prior to admission is  present.       Obs       Level of care   tele  For   24H   Precautions:  NONE  No active isolations  PPE: Used by the provider:   P100  eye Goggles,  Gloves    Akhila Mahnken 10/05/2019, 7:56 PM    [facility]     after 2 AM please page floor coverage PA If 7AM-7PM, please contact the day team taking care of the patient using [email or web address]

## 2019-10-05 NOTE — Progress Notes (Signed)
CRITICAL VALUE ALERT  Critical Value:  K <2.0  Date & Time Notied:  10/05/19 11:05 PM  Provider Notified: Hayden Pedro NP  Orders Received/Actions taken: see MAR for new orders

## 2019-10-05 NOTE — ED Notes (Signed)
C/o swelling to left leg onset fri. States it got bigger on sat, denies pain . Patient left leg with pitting edema from thigh to foot. Doppler pulse in left foot.

## 2019-10-05 NOTE — H&P (Addendum)
Progress NOTE   Jenna Kelley [id number] DOB: 1951-11-07 DOA: 10/05/2019  PCP: Lewis Moccasin, MD Consultants:  None   HPI: Jenna Kelley is a 68 y.o. female with no reported past medical history presenting with LLE swelling.    COVID negative 10/26.  ED Course:  Super impressive LLE DVT.  No h/o prior, not having significant pain.  [place] with diffuse clot with significant clot burden.  Started on Heparin.  No concern for PE.  Normal O2 sats.     History reviewed. No pertinent past medical history.  History reviewed. No pertinent surgical history.  Social History   Socioeconomic History  . Marital status: Single    Spouse name: Not on file  . Number of children: Not on file  . Years of education: Not on file  . Highest education level: Not on file  Occupational History  . Not on file  Social Needs  . Financial resource strain: Not on file  . Food insecurity    Worry: Not on file    Inability: Not on file  . Transportation needs    Medical: Not on file    Non-medical: Not on file  Tobacco Use  . Smoking status: Never Smoker  Substance and Sexual Activity  . Alcohol use: No  . Drug use: Not on file  . Sexual activity: Not on file  Lifestyle  . Physical activity    Days per week: Not on file    Minutes per session: Not on file  . Stress: Not on file  Relationships  . Social Musician on phone: Not on file    Gets together: Not on file    Attends religious service: Not on file    Active member of club or organization: Not on file    Attends meetings of clubs or organizations: Not on file    Relationship status: Not on file  . Intimate partner violence    Fear of current or ex partner: Not on file    Emotionally abused: Not on file    Physically abused: Not on file    Forced sexual activity: Not on file  Other Topics Concern  . Not on file  Social History Narrative  . Not on file    No Known Allergies  Family History   Problem Relation Age of Onset  . Breast cancer Neg Hx     Prior to Admission medications   Medication Sig Start Date End Date Taking? Authorizing Provider  naproxen (NAPROSYN) 500 MG tablet Take 1 tablet (500 mg total) by mouth 2 (two) times daily. 02/07/16   Rolland Porter, MD    Physical Exam: Vitals:   10/05/19 1106 10/05/19 1115  BP: (!) 110/53   Pulse: 68   Resp: 14   Temp: 98.1 F (36.7 C)   TempSrc: Oral   SpO2: 100%   Weight:  60.8 kg  Height:  5\' 6"  (1.676 m)     Radiological Exams on Admission: No results found.  EKG: not done   Labs on Admission: I have personally reviewed the available labs and imaging studies at the time of the admission.  Pertinent labs:   10/26 COVID negative None done   Assessment/Plan Active Problems:   * No active hospital problems. *     DVT -Patient has not had any labs -She is not having pain -She is presenting with a DVT with no concern for PE at this time, per report -As such, I have  asked the ER resident to carefully consider if admission is indicated - it is usually not for DVT -If admission is requested, please reconsult [facility] when appropriate evaluation is complete.  NOTE: Entry error - this is NOT an H&P and is a Progress note at this time, as the patient is not certainly in need of hospitalization at this time.    Karmen Bongo MD [facility]   How to contact the [facility] Attending or Consulting provider [place] or covering provider during after hours [place], for this patient?  1. Check the care team in [facility] and look for a) attending/consulting [facility] provider listed and b) the [facility] team listed 2. Log into [email or web address] and use [place]'s universal password to access. If you do not have the password, please contact the hospital operator. 3. Locate the [facility] provider you are looking for under [facility] and page to a number that you can be directly reached. 4. If you still have difficulty reaching the  provider, please page the [facility] (Director on Call) for the Hospitalists listed on amion for assistance.   10/05/2019, 1:35 PM

## 2019-10-05 NOTE — ED Provider Notes (Signed)
[facility] EMERGENCY DEPARTMENT Provider Note   CSN: [id number] Arrival date & time: 10/05/19  1100     History   Chief Complaint Chief Complaint  Patient presents with   Leg Swelling    HPI Jenna Kelley is a 68 y.o. female.      Leg Pain Location:  Leg Time since incident:  4 days Injury: no   Leg location:  L leg Pain details:    Quality:  Pressure   Radiates to:  Does not radiate   Severity:  No pain   Onset quality:  Gradual   Duration:  4 days   Timing:  Constant   Progression:  Worsening Chronicity:  New Dislocation: no   Foreign body present:  No foreign bodies Prior injury to area:  No Relieved by:  None tried Worsened by:  Nothing Ineffective treatments:  None tried Associated symptoms: decreased ROM and swelling   Associated symptoms: no back pain, no fever, no muscle weakness, no neck pain and no numbness     History reviewed. No pertinent past medical history.  There are no active problems to display for this patient.   History reviewed. No pertinent surgical history.   OB History   No obstetric history on file.      Home Medications    Prior to Admission medications   Medication Sig Start Date End Date Taking? Authorizing Provider  Multiple Vitamin (MULTIVITAMIN WITH MINERALS) TABS tablet Take 1 tablet by mouth daily.   Yes [provider]  ciprofloxacin (CIPRO) 500 MG tablet Take 500 mg by mouth 2 (two) times daily. 09/28/19   [provider]    Family History Family History  Problem Relation Age of Onset   Breast cancer Neg Hx     Social History Social History   Tobacco Use   Smoking status: Never Smoker  Substance Use Topics   Alcohol use: No   Drug use: Not on file     Allergies   Patient has no known allergies.   Review of Systems Review of Systems  Constitutional: Negative for chills and fever.  HENT: Negative for ear pain and sore throat.   Eyes: Negative for  pain and visual disturbance.  Respiratory: Negative for cough and shortness of breath.   Cardiovascular: Negative for chest pain and palpitations.  Gastrointestinal: Negative for abdominal pain and vomiting.  Genitourinary: Negative for dysuria and hematuria.  Musculoskeletal: Positive for gait problem (due to largely swolen LLE). Negative for arthralgias, back pain, neck pain and neck stiffness.  Skin: Negative for color change and rash.  Neurological: Negative for seizures and syncope.  All other systems reviewed and are negative.    Physical Exam Updated Vital Signs BP (!) 100/58    Pulse (!) 59    Temp 98.1 F (36.7 C) (Oral)    Resp 14    Ht  (1.676 m)    Wt 60.8 kg    SpO2 100%    BMI 21.63 kg/m   Physical Exam Vitals signs and nursing note reviewed.  Constitutional:      General: She is not in acute distress.    Appearance: She is well-developed. She is ill-appearing.  HENT:     Head: Normocephalic and atraumatic.  Eyes:     Conjunctiva/sclera: Conjunctivae normal.  Neck:     Musculoskeletal: Neck supple.  Cardiovascular:     Rate and Rhythm: Normal rate and regular rhythm.     Heart sounds: No murmur.  Pulmonary:     Effort: Pulmonary effort is normal. No respiratory distress.     Breath sounds: Normal breath sounds.  Abdominal:     Palpations: Abdomen is soft.     Tenderness: There is no abdominal tenderness.  Musculoskeletal:     Left lower leg: Edema (Severe left lower extremity edema.  Left lower extremity is approximately twice a circumference of right lower extremity.  Pitting edema present.) present.  Skin:    General: Skin is warm and dry.  Neurological:     General: No focal deficit present.     Mental Status: She is alert.      ED Treatments / Results  Labs (all labs ordered are listed, but only abnormal results are displayed) Labs Reviewed  SARS CORONAVIRUS 2 (TAT 6-24 HRS)  APTT  PROTIME-INR  CBC  HEPARIN LEVEL (UNFRACTIONATED)  BASIC  METABOLIC PANEL  CBC  HEPARIN LEVEL (UNFRACTIONATED)    EKG None  Radiology Vas [place] Lower Extremity Venous (dvt) (only [facility])  Result Date: 10/05/2019  Lower Venous Study Indications: Edema.  Limitations: Abdmonial gas and body habitus. Comparison Study: No prior study on file for comparison Performing Technologist: Sherren Kerns RVS  Examination Guidelines: A complete evaluation includes B-mode imaging, spectral Doppler, color Doppler, and power Doppler as needed of all accessible portions of each vessel. Bilateral testing is considered an integral part of a complete examination. Limited examinations for reoccurring indications may be performed as noted.  +-----+---------------+---------+-----------+----------+--------------+  RIGHT Compressibility Phasicity Spontaneity Properties Thrombus Aging  +-----+---------------+---------+-----------+----------+--------------+  CFV   Full            Yes       Yes                                    +-----+---------------+---------+-----------+----------+--------------+   +---------+---------------+---------+-----------+----------+--------------+  LEFT      Compressibility Phasicity Spontaneity Properties Thrombus Aging  +---------+---------------+---------+-----------+----------+--------------+  CFV       None            No        No                     Acute           +---------+---------------+---------+-----------+----------+--------------+  SFJ       None                                             Acute           +---------+---------------+---------+-----------+----------+--------------+  FV Prox   None                                             Acute           +---------+---------------+---------+-----------+----------+--------------+  FV Mid    None                                             Acute           +---------+---------------+---------+-----------+----------+--------------+  FV Distal None  Acute            +---------+---------------+---------+-----------+----------+--------------+  PFV       Full                                                             +---------+---------------+---------+-----------+----------+--------------+  POP       None            No        No                     Acute           +---------+---------------+---------+-----------+----------+--------------+  PTV       None                                             Acute           +---------+---------------+---------+-----------+----------+--------------+  PERO      None                                             Acute           +---------+---------------+---------+-----------+----------+--------------+  Gastroc   None                                             Acute           +---------+---------------+---------+-----------+----------+--------------+  EIV       None                                             Acute           +---------+---------------+---------+-----------+----------+--------------+  CIV                                                        Not visualized  +---------+---------------+---------+-----------+----------+--------------+     Summary: Right: No evidence of common femoral vein obstruction. Left: Findings consistent with acute deep vein thrombosis involving the left external iliac vein, common femoral vein, left femoral vein, left popliteal vein, left posterior tibial veins, left peroneal veins, and left gastrocnemius veins. Uanble to visualize common iliac vein.  *See table(s) above for measurements and observations.    Preliminary     Procedures Procedures (including critical care time)  Medications Ordered in ED Medications  heparin ADULT infusion 100 units/mL (25000 units/24150mL sodium chloride 0.45%) (1,000 Units/hr Intravenous New Bag/Given 10/05/19 1535)  heparin bolus via infusion 3,500 Units (3,500 Units Intravenous Bolus from Bag 10/05/19 1535)     Initial Impression / Assessment and Plan / ED  Course  I have reviewed the triage vital signs and the nursing notes.  Pertinent labs & imaging results that were available during my care of the patient were reviewed by me and considered in my medical decision making (see chart for details).        Patient is a 68 year old female with history of physical exam as above presents emergency department for evaluation of large left lower extremity swelling that is been ongoing since last Thursday.  Patient is hemodynamically stable at the time of my initial evaluation.  Afebrile.  Denies any symptoms of shortness of breath or pain in that affected lower extremity.  Due to the large distention of the left lower extremity concern is raised for DVT.  She has had no previous history of thrombosis.  No recent surgery or other clear provoking factor although she has been less active recently.  DVT study demonstrates extensive clot burden in the left lower extremity.  I discussed this patient with interventional radiology for evaluation of patient for possible procedure for her left lower extremity thrombus.  Patient will be admitted to hospitalist service for likely procedure performed by interventional radiology.  Heparin initiated in the emergency department after my discussion with interventional radiology.  Patient is no evidence of PE and no symptoms concerning for same therefore no further work-up for PE indicated at this time.  Patient waiting admission at the time of transition of care to oncoming team at 1600.  Patient seen in conjunction with my attending physician Dr. Vanita Panda.  Final Clinical Impressions(s) / ED Diagnoses   Final diagnoses:  Acute deep vein thrombosis (DVT) of popliteal vein of left lower extremity [facility])    ED Discharge Orders    None       Romona Curls, MD 10/05/19 Cato Mulligan    Carmin Muskrat, MD 10/08/19 [phone]

## 2019-10-05 NOTE — Progress Notes (Signed)
ANTICOAGULATION CONSULT NOTE - Follow Up Consult  Pharmacy Consult for IV Heparin Indication: DVT  No Known Allergies  Patient Measurements: Height: 5\' 6"  (167.6 cm) Weight: 134 lb (60.8 kg) IBW/kg (Calculated) : 59.3 Heparin Dosing Weight: 60.8 kg  Vital Signs: Temp: 99 F (37.2 C) (11/02 2140) Temp Source: Oral (11/02 2140) BP: 124/105 (11/02 2140) Pulse Rate: 59 (11/02 1915)  Labs: Recent Labs    10/05/19 1535 10/05/19 1700 10/05/19 2216  HGB  --   --  11.5*  HCT  --   --  36.1  PLT  --   --  297  APTT 27  --   --   LABPROT 13.8  --   --   INR 1.1  --   --   HEPARINUNFRC  --   --  0.45  CREATININE  --  0.65  --     Estimated Creatinine Clearance: 63 mL/min (by C-G formula based on SCr of 0.65 mg/dL).   Medications:  Infusions:  . sodium chloride 75 mL/hr at 10/05/19 2200  . heparin 1,000 Units/hr (10/05/19 1535)    Assessment: 68 year old female with DVT extending into external iliac vein on IV heparin.   Initial heparin level is therapeutic at 0.45 after bolus of 3500 units and drip rate of 1000 units/hr.  Hgb is 11.5. Platelets are within normal limits.  No bleeding reported.  Plan for possible DVT lysis/thrombectomy in AM.   Goal of Therapy:  Heparin level 0.3-0.7 units/ml Monitor platelets by anticoagulation protocol: Yes   Plan:  Continue Heparin at 1000 units/hr.  Follow-up level in AM.  Follow-up plan for lysis/thrombectomy.  Brain Hilts 10/05/2019,10:50 PM

## 2019-10-05 NOTE — ED Triage Notes (Signed)
Pt reports left leg swelling since Saturday, denies recent fevers, no history of blood clots. VSS. NAD at present.

## 2019-10-05 NOTE — ED Provider Notes (Signed)
  Physical Exam  BP (!) 100/58   Pulse (!) 59   Temp 98.1 F (36.7 C) (Oral)   Resp 14   Ht 5\' 6"  (1.676 m)   Wt 60.8 kg   SpO2 100%   BMI 21.63 kg/m   MDM  Patient reassessed at the time of handoff.  Plan is to admit to medicine with vascular surgery following for possible thrombectomy regarding large unprovoked DVT.  No sign of PE at this time.  Handoff given to admitting team.  Care of patient discussed with supervising attending.   Julianne Rice, MD 10/05/19 Matthew Saras    Varney Biles, MD 10/06/19 [phone]

## 2019-10-05 NOTE — Progress Notes (Signed)
VASCULAR LAB PRELIMINARY  PRELIMINARY  PRELIMINARY  PRELIMINARY  Left lower extremity venous duplex completed.    Preliminary report:  See CV proc for preliminary results.    Gave Dr. Vanita Panda Report.  Davie Sagona, RVT 10/05/2019, 2:57 PM

## 2019-10-05 NOTE — Progress Notes (Signed)
ANTICOAGULATION CONSULT NOTE - Initial Consult  Pharmacy Consult for heparin Indication: VTE Treatment  No Known Allergies  Patient Measurements: Height: 5\' 6"  (167.6 cm) Weight: 134 lb (60.8 kg) IBW/kg (Calculated) : 59.3 Heparin Dosing Weight: 60.8 kg   Vital Signs: Temp: 98.1 F (36.7 C) (11/02 1106) Temp Source: Oral (11/02 1106) BP: 110/53 (11/02 1106) Pulse Rate: 68 (11/02 1106)  Labs: No results for input(s): HGB, HCT, PLT, APTT, LABPROT, INR, HEPARINUNFRC, HEPRLOWMOCWT, CREATININE, CKTOTAL, CKMB, TROPONINIHS in the last 72 hours.  CrCl cannot be calculated (No successful lab value found.).   Medical History: History reviewed. No pertinent past medical history.  Medications:  (Not in a hospital admission)   Assessment: 68 yo female presented on 10/05/2019 with LLE swelling concern for DVT. Pharmacy consulted to dose heparin for VTE treatment. No current labs available though baseline CBC, aPTT and PT/INR ordered. No reported bleeding or reported anticoagulation use prior.   Goal of Therapy:  Heparin level 0.3-0.7 units/ml Monitor platelets by anticoagulation protocol: Yes   Plan:  Heparin 3500 units bolus  Start heparin 1000 units/hr   Check heparin level at 2100 on 11/2  Monitor heparin level, CBC, and S/S of bleeding daily  Follow up transition to oral anticoagulation   Cristela Felt, PharmD PGY1 Pharmacy Resident [employer]: [phone]  10/05/2019,2:36 PM

## 2019-10-06 DIAGNOSIS — I82462 Acute embolism and thrombosis of left calf muscular vein: Secondary | ICD-10-CM | POA: Diagnosis present

## 2019-10-06 DIAGNOSIS — R001 Bradycardia, unspecified: Secondary | ICD-10-CM | POA: Diagnosis present

## 2019-10-06 DIAGNOSIS — M79605 Pain in left leg: Secondary | ICD-10-CM | POA: Diagnosis present

## 2019-10-06 DIAGNOSIS — K439 Ventral hernia without obstruction or gangrene: Secondary | ICD-10-CM | POA: Diagnosis present

## 2019-10-06 DIAGNOSIS — I82432 Acute embolism and thrombosis of left popliteal vein: Secondary | ICD-10-CM | POA: Diagnosis present

## 2019-10-06 DIAGNOSIS — I82412 Acute embolism and thrombosis of left femoral vein: Secondary | ICD-10-CM | POA: Diagnosis present

## 2019-10-06 DIAGNOSIS — K0889 Other specified disorders of teeth and supporting structures: Secondary | ICD-10-CM | POA: Diagnosis present

## 2019-10-06 DIAGNOSIS — E876 Hypokalemia: Secondary | ICD-10-CM | POA: Diagnosis present

## 2019-10-06 DIAGNOSIS — I82452 Acute embolism and thrombosis of left peroneal vein: Secondary | ICD-10-CM | POA: Diagnosis present

## 2019-10-06 DIAGNOSIS — Z6821 Body mass index (BMI) 21.0-21.9, adult: Secondary | ICD-10-CM | POA: Diagnosis not present

## 2019-10-06 DIAGNOSIS — R634 Abnormal weight loss: Secondary | ICD-10-CM | POA: Diagnosis present

## 2019-10-06 DIAGNOSIS — I82442 Acute embolism and thrombosis of left tibial vein: Secondary | ICD-10-CM | POA: Diagnosis present

## 2019-10-06 DIAGNOSIS — Z20828 Contact with and (suspected) exposure to other viral communicable diseases: Secondary | ICD-10-CM | POA: Diagnosis present

## 2019-10-06 DIAGNOSIS — I82422 Acute embolism and thrombosis of left iliac vein: Secondary | ICD-10-CM | POA: Diagnosis present

## 2019-10-06 LAB — COMPREHENSIVE METABOLIC PANEL
ALT: 38 U/L (ref 0–44)
AST: 42 U/L — ABNORMAL HIGH (ref 15–41)
Albumin: 1.8 g/dL — ABNORMAL LOW (ref 3.5–5.0)
Alkaline Phosphatase: 55 U/L (ref 38–126)
Anion gap: 10 (ref 5–15)
BUN: <5 mg/dL — ABNORMAL LOW (ref 8–23)
CO2: 34 mmol/L — ABNORMAL HIGH (ref 22–32)
Calcium: 7.9 mg/dL — ABNORMAL LOW (ref 8.9–10.3)
Chloride: 100 mmol/L (ref 98–111)
Creatinine, Ser: 0.61 mg/dL (ref 0.44–1.00)
GFR calc Af Amer: >60 mL/min (ref >60)
GFR calc non Af Amer: >60 mL/min (ref >60)
Glucose, Bld: 99 mg/dL (ref 70–99)
Potassium: <2 mmol/L — CL (ref 3.5–5.1)
Sodium: 144 mmol/L (ref 135–145)
Total Bilirubin: 0.6 mg/dL (ref 0.3–1.2)
Total Protein: 5 g/dL — ABNORMAL LOW (ref 6.5–8.1)

## 2019-10-06 LAB — BASIC METABOLIC PANEL
Anion gap: 12 (ref 5–15)
BUN: <5 mg/dL — ABNORMAL LOW (ref 8–23)
CO2: 34 mmol/L — ABNORMAL HIGH (ref 22–32)
Calcium: 8 mg/dL — ABNORMAL LOW (ref 8.9–10.3)
Chloride: 100 mmol/L (ref 98–111)
Creatinine, Ser: 0.71 mg/dL (ref 0.44–1.00)
GFR calc Af Amer: >60 mL/min (ref >60)
GFR calc non Af Amer: >60 mL/min (ref >60)
Glucose, Bld: 110 mg/dL — ABNORMAL HIGH (ref 70–99)
Potassium: 2.3 mmol/L — CL (ref 3.5–5.1)
Sodium: 146 mmol/L — ABNORMAL HIGH (ref 135–145)

## 2019-10-06 LAB — CBC
HCT: 30.8 % — ABNORMAL LOW (ref 36.0–46.0)
Hemoglobin: 9.9 g/dL — ABNORMAL LOW (ref 12.0–15.0)
MCH: 29.1 pg (ref 26.0–34.0)
MCHC: 32.1 g/dL (ref 30.0–36.0)
MCV: 90.6 fL (ref 80.0–100.0)
Platelets: 266 10*3/uL (ref 150–400)
RBC: 3.4 MIL/uL — ABNORMAL LOW (ref 3.87–5.11)
RDW: 16 % — ABNORMAL HIGH (ref 11.5–15.5)
WBC: 4.5 10*3/uL (ref 4.0–10.5)
nRBC: 0 % (ref 0.0–0.2)

## 2019-10-06 LAB — TSH: TSH: 0.684 u[IU]/mL (ref 0.350–4.500)

## 2019-10-06 LAB — GLUCOSE, CAPILLARY
Glucose-Capillary: 87 mg/dL (ref 70–99)
Glucose-Capillary: 116 mg/dL — ABNORMAL HIGH (ref 70–99)

## 2019-10-06 LAB — PHOSPHORUS: Phosphorus: 2.6 mg/dL (ref 2.5–4.6)

## 2019-10-06 LAB — HEPARIN LEVEL (UNFRACTIONATED): Heparin Unfractionated: 0.42 IU/mL (ref 0.30–0.70)

## 2019-10-06 LAB — HIV ANTIBODY (ROUTINE TESTING W REFLEX): HIV Screen 4th Generation wRfx: NONREACTIVE

## 2019-10-06 LAB — MAGNESIUM: Magnesium: 2.2 mg/dL (ref 1.7–2.4)

## 2019-10-06 MED ORDER — POTASSIUM CHLORIDE CRYS ER 20 MEQ PO TBCR
40.0000 meq | EXTENDED_RELEASE_TABLET | ORAL | Status: AC
  Administered 2019-10-06 (×4): 40 meq via ORAL
  Filled 2019-10-06 (×4): qty 2

## 2019-10-06 NOTE — Progress Notes (Signed)
CRITICAL VALUE STICKER  CRITICAL VALUE: Potassium 2.3   DATE & TIME NOTIFIED: 5:54 PM   MD NOTIFIED: Darliss Cheney MD  TIME OF NOTIFICATION: 5:54 PM

## 2019-10-06 NOTE — Progress Notes (Signed)
Compression stocking applied per MD and PT currently working with patient

## 2019-10-06 NOTE — Progress Notes (Signed)
ANTICOAGULATION CONSULT NOTE - Follow Up Consult  Pharmacy Consult for IV Heparin Indication: DVT  No Known Allergies  Patient Measurements: Height: 5\' 6"  (167.6 cm) Weight: 134 lb (60.8 kg) IBW/kg (Calculated) : 59.3 Heparin Dosing Weight: 60.8 kg  Vital Signs: Temp: 99.6 F (37.6 C) (11/03 0511) Temp Source: Oral (11/03 0511) BP: 124/78 (11/03 0511)  Labs: Recent Labs    10/05/19 1535 10/05/19 1700 10/05/19 2216 10/06/19 0506  HGB  --   --  11.5* 9.9*  HCT  --   --  36.1 30.8*  PLT  --   --  297 266  APTT 27  --   --   --   LABPROT 13.8  --   --   --   INR 1.1  --   --   --   HEPARINUNFRC  --   --  0.45 0.42  CREATININE  --  0.65 0.66 0.61    Estimated Creatinine Clearance: 63 mL/min (by C-G formula based on SCr of 0.61 mg/dL).   Medications:  Infusions:  . dextrose 5 % lactated ringers with KCl/Additives Pediatric custom IV fluid 100 mL/hr at 10/06/19 0300  . heparin 1,000 Units/hr (10/06/19 0300)    Assessment: 68 year old female with DVT extending into external iliac vein on IV heparin.   Heparin level remains therapeutic at 0.42 on heparin drip rate of 1000 units/hr.  Hgb is 11.5>down to 9.9 today. Platelets remain within normal limits.  No bleeding reported.  Plan for possible DVT lysis/thrombectomy this AM.   Goal of Therapy:  Heparin level 0.3-0.7 units/ml Monitor platelets by anticoagulation protocol: Yes   Plan:  Continue Heparin at 1000 units/hr.  Daily Heparin level and CBC Follow-up plan for lysis/thrombectomy.  Thank you for allowing pharmacy to be part of this patients care team.  Nicole Cella, RPh Clinical Pharmacist Please check AMION for all [place] phone numbers After 10:00 PM, call [place] [phone] 10/06/2019,8:05 AM

## 2019-10-06 NOTE — Care Management (Signed)
Received consult "DOAC"  Entered benefit benefit check for Xarelto 20 mg PO daily x 30 days and Eliquis 5 mg PO BID x 30 days.   Patient can use 30 free card for Xarelto or Eliquis once, however does not qualify for co pay card.   Magdalen Spatz RN

## 2019-10-06 NOTE — Progress Notes (Signed)
PROGRESS NOTE    Jenna Kelley  ZOX:[id number] DOB: Mar 20, 1951 DOA: 10/05/2019 PCP: Lewis Moccasin, MD   Brief Narrative:  Jenna Kelley is a 68 y.o. female with medical history significant of supraumbelical Hernia presented to ED with left leg swelling for 4 days duration.  No shortness of breath or any other complaint.  She was diagnosed with extensive left lower extremity DVT and started on heparin drip and IR was consulted for thrombectomy.  Assessment & Plan:   Active Problems:   DVT (deep venous thrombosis) (HCC)   Hypokalemia   Acute deep vein thrombosis (DVT) of calf muscle vein of left lower extremity (HCC)   Extensive left lower extremity DVT: Large clot burden.  Denies any pain.  IR recommended thrombolysis however patient has declined that as of now.  Per my discussion with Dr. Loreta Ave from IR, patient had the choice to make a decision within 2 weeks.  She can be discharged on oral anticoagulant once medically stable and she will have all the information from IR about how to contact them to arrange outpatient thrombolysis.  Severe hypokalemia: She came in with potassium of less than 2.  She received couple of doses of replacement and her potassium is still is less than 2.  I will give her potassium chloride p.o. 40 mEq every 4 hours, 4 doses and recheck in the morning.  Monitor on telemetry.  DVT prophylaxis: Heparin drip Code Status: Full code Family Communication:  None present at bedside.  Plan of care discussed with patient in length and he verbalized understanding and agreed with it. Disposition Plan: Potential discharge tomorrow.  Estimated body mass index is 21.63 kg/m as calculated from the following:   Height as of this encounter:  (1.676 m).   Weight as of this encounter: 60.8 kg.      Nutritional status:               Consultants:   IR  Procedures:   None  Antimicrobials:   None   Subjective: Patient seen and  examined.  She has no complaint.  Denies any pain.  I also tried to convince her to have thrombolysis however she is adamant on not having that as of now but she said that she will consider further to get it as an outpatient.  She understands that she has 2 weeks window for that.  Objective: Vitals:   10/05/19 1915 10/05/19 2140 10/06/19 0511 10/06/19 1200  BP: (!) 106/53 (!) 124/105 124/78 109/65  Pulse: (!) 59   61  Resp: Temp:  99 F (37.2 C) 99.6 F (37.6 C) 98.9 F (37.2 C)  TempSrc:  Oral Oral Oral  SpO2: 100% 100% 98% 100%  Weight:      Height:        Intake/Output Summary (Last 24 hours) at 10/06/2019 1333 Last data filed at 10/06/2019 0300 Gross per 24 hour  Intake 380.03 ml  Output --  Net 380.03 ml   Filed Weights   10/05/19 1115  Weight: 60.8 kg    Examination:  General exam: Appears calm and comfortable  Respiratory system: Clear to auscultation. Respiratory effort normal. Cardiovascular system: S1 & S2 heard, RRR. No JVD, murmurs, rubs, gallops or clicks.  Gastrointestinal system: Abdomen is nondistended, soft and nontender. No organomegaly or masses felt. Normal bowel sounds heard. Central nervous system: Alert and oriented. No focal neurological deficits. Extremities: Symmetric 5 x 5 power.  Extensive swelling of the left  lower extremity with +1 pretibial edema. Skin: No rashes, lesions or ulcers Psychiatry: Judgement and insight appear poor. Mood & affect appropriate.    Data Reviewed: I have personally reviewed following labs and imaging studies  CBC: Recent Labs  Lab 10/05/19 2216 10/06/19 0506  WBC 4.1 4.5  NEUTROABS 2.2  --   HGB 11.5* 9.9*  HCT 36.1 30.8*  MCV 90.9 90.6  PLT 297 782   Basic Metabolic Panel: Recent Labs  Lab 10/05/19 1700 10/05/19 2216 10/06/19 0506  NA 143 143 144  K <2.0* <2.0* <2.0*  CL 98 94* 100  CO2 37* 37* 34*  GLUCOSE 104* 118* 99  BUN <5* <5* <5*  CREATININE 0.65 0.66 0.61  CALCIUM 8.1* 8.5*  7.9*  MG  --  2.2 2.2  PHOS  --  2.7 2.6   GFR: Estimated Creatinine Clearance: 63 mL/min (by C-G formula based on SCr of 0.61 mg/dL). Liver Function Tests: Recent Labs  Lab 10/05/19 2216 10/06/19 0506  AST 47* 42*  ALT 46* 38  ALKPHOS 72 55  BILITOT 1.0 0.6  PROT 6.0* 5.0*  ALBUMIN 2.2* 1.8*   No results for input(s): LIPASE, AMYLASE in the last 168 hours. No results for input(s): AMMONIA in the last 168 hours. Coagulation Profile: Recent Labs  Lab 10/05/19 1535  INR 1.1   Cardiac Enzymes: No results for input(s): CKTOTAL, CKMB, CKMBINDEX, TROPONINI in the last 168 hours. BNP (last 3 results) No results for input(s): PROBNP in the last 8760 hours. HbA1C: No results for input(s): HGBA1C in the last 72 hours. CBG: Recent Labs  Lab 10/06/19 1141  GLUCAP 87   Lipid Profile: No results for input(s): CHOL, HDL, LDLCALC, TRIG, CHOLHDL, LDLDIRECT in the last 72 hours. Thyroid Function Tests: Recent Labs    10/06/19 0506  TSH 0.684   Anemia Panel: No results for input(s): VITAMINB12, FOLATE, FERRITIN, TIBC, IRON, RETICCTPCT in the last 72 hours. Sepsis Labs: No results for input(s): PROCALCITON, LATICACIDVEN in the last 168 hours.  Recent Results (from the past 240 hour(s))  Novel Coronavirus, NAA ([facility])     Status: None   Collection Time: 09/28/19 12:00 AM   Specimen: Oropharyngeal(OP) collection in vial transport medium   OROPHARYNGEA  TESTING  Result Value Ref Range Status   SARS-CoV-2, NAA Not Detected Not Detected Final    Comment: Testing was performed using the cobas(R) SARS-CoV-2 test. This nucleic acid amplification test was developed and its performance characteristics determined by [employer]. Nucleic acid amplification tests include PCR and TMA. This test has not been FDA cleared or approved. This test has been authorized by FDA under an Emergency Use Authorization (EUA). This test is only authorized for the duration of time the  declaration that circumstances exist justifying the authorization of the emergency use of in vitro diagnostic tests for detection of SARS-CoV-2 virus and/or diagnosis of COVID-19 infection under section 564(b)(1) of the Act, 21 U.S.C. 956OZH-0(Q) (1), unless the authorization is terminated or revoked sooner. When diagnostic testing is negative, the possibility of a false negative result should be considered in the context of a patient's recent exposures and the presence of clinical signs and symptoms consistent with COVID-19. An individual without symptoms  of COVID-19 and who is not shedding SARS-CoV-2 virus would expect to have a negative (not detected) result in this assay.   SARS CORONAVIRUS 2 (TAT 6-24 HRS) Nasopharyngeal Nasopharyngeal Swab     Status: None   Collection Time: 10/05/19  1:50 PM   Specimen:  Nasopharyngeal Swab  Result Value Ref Range Status   SARS Coronavirus 2 NEGATIVE NEGATIVE Final    Comment: (NOTE) SARS-CoV-2 target nucleic acids are NOT DETECTED. The SARS-CoV-2 RNA is generally detectable in upper and lower respiratory specimens during the acute phase of infection. Negative results do not preclude SARS-CoV-2 infection, do not rule out co-infections with other pathogens, and should not be used as the sole basis for treatment or other patient management decisions. Negative results must be combined with clinical observations, patient history, and epidemiological information. The expected result is Negative. Fact Sheet for Patients: HairSlick.no Fact Sheet for Healthcare Providers: quierodirigir.com This test is not yet approved or cleared by the [place] FDA and  has been authorized for detection and/or diagnosis of SARS-CoV-2 by FDA under an Emergency Use Authorization (EUA). This EUA will remain  in effect (meaning this test can be used) for the duration of the COVID-19 declaration under Section  56 4(b)(1) of the Act, 21 U.S.C. section 360bbb-3(b)(1), unless the authorization is terminated or revoked sooner. Performed at [facility], [address], [place], [place] [address]       Radiology Studies: Ct Venogram Abd/pel  Result Date: 10/05/2019 CLINICAL DATA:  Large distention of left lower extremity with concern for DVT. EXAM: CT ABDOMEN AND PELVIS WITH CONTRAST TECHNIQUE: Multidetector CT imaging of the abdomen and pelvis was performed using the standard protocol following bolus administration of intravenous contrast. Venogram protocol is performed. The bilateral lower extremities were evaluated. CONTRAST:  OMNIPAQUE IOHEXOL 350 MG/ML SOLN COMPARISON:  None. FINDINGS: Lower chest: The lung bases are clear. The heart size is normal. Hepatobiliary: The liver is normal. Normal gallbladder.There is no biliary ductal dilation. Pancreas: Normal contours without ductal dilatation. No peripancreatic fluid collection. Spleen: No splenic laceration or hematoma. Adrenals/Urinary Tract: --Adrenal glands: No adrenal hemorrhage. --Right kidney/ureter: No hydronephrosis or perinephric hematoma. --Left kidney/ureter: No hydronephrosis or perinephric hematoma. --Urinary bladder: Unremarkable. Stomach/Bowel: --Stomach/Duodenum: No hiatal hernia or other gastric abnormality. Normal duodenal course and caliber. --Small bowel: No dilatation or inflammation. --Colon: There is a large amount of stool throughout the colon. There is an abdominal wall hernia containing a loop of the transverse colon. The colon leading into the hernia demonstrates wall thickening. There is no significant free fluid in the hernia sac. There is no pneumatosis. --Appendix: Normal. Vascular/Lymphatic: Atherosclerotic changes are noted throughout the abdominal aorta without evidence for an abdominal aortic aneurysm. There is an extensive DVT involving the left lower extremity extending from the left external iliac vein through  the popliteal veins and into the tibial venous structures. The thrombus is likely occlusive to near occlusive. The DVT partially extends into the left common iliac vein but does not clearly extend into the IVC. There is no evidence for a DVT involving the right lower extremity. The lower extremity arterial vasculature is grossly patent without evidence for high-grade stenosis. Evaluation is somewhat limited by contrast bolus timing. --No retroperitoneal lymphadenopathy. --No mesenteric lymphadenopathy. --No pelvic or inguinal lymphadenopathy. Reproductive: Unremarkable Other: No ascites or free air. There is extensive asymmetric left lower extremity soft tissue edema and swelling. This is almost certainly due to the patient's extensive lower extremity DVT. Musculoskeletal. There is no displaced fracture. No dislocation. There are end-stage degenerative changes of the right hip. There are advanced degenerative changes of both knees, greatest within the lateral compartments bilaterally. There are small to moderate-sized bilateral suprapatellar joint effusions. IMPRESSION: 1. Extensive acute appearing deep vein thrombosis involving the left lower  extremity, from the left common iliac vein through the tibial veins. There is extensive associated asymmetric left lower extremity swelling and edema. The thrombus does not appear to extend into the IVC. 2. Large ventral wall hernia containing a loop of transverse colon. There is no obstruction, however there is wall thickening of the transverse colon leading up to the hernia. This may represent early strangulation or incarceration. Other differential considerations include a focal colitis. An underlying mass cannot be excluded. Consider outpatient colonoscopy as clinically indicated. These results were called by telephone at the time of interpretation on 10/05/2019 at 11:20 pm to provider Eduard Roux, who verbally acknowledged these results. Electronically Signed   By:  Katherine Mantle M.D.   On: 10/05/2019 23:17   Vas [place] Lower Extremity Venous (dvt) (only [facility])  Result Date: 10/06/2019  Lower Venous Study Indications: Edema.  Limitations: Abdmonial gas and body habitus. Comparison Study: No prior study on file for comparison Performing Technologist: Sherren Kerns RVS  Examination Guidelines: A complete evaluation includes B-mode imaging, spectral Doppler, color Doppler, and power Doppler as needed of all accessible portions of each vessel. Bilateral testing is considered an integral part of a complete examination. Limited examinations for reoccurring indications may be performed as noted.  +-----+---------------+---------+-----------+----------+--------------+  RIGHT Compressibility Phasicity Spontaneity Properties Thrombus Aging  +-----+---------------+---------+-----------+----------+--------------+  CFV   Full            Yes       Yes                                    +-----+---------------+---------+-----------+----------+--------------+   +---------+---------------+---------+-----------+----------+--------------+  LEFT      Compressibility Phasicity Spontaneity Properties Thrombus Aging  +---------+---------------+---------+-----------+----------+--------------+  CFV       None            No        No                     Acute           +---------+---------------+---------+-----------+----------+--------------+  SFJ       None                                             Acute           +---------+---------------+---------+-----------+----------+--------------+  FV Prox   None                                             Acute           +---------+---------------+---------+-----------+----------+--------------+  FV Mid    None                                             Acute           +---------+---------------+---------+-----------+----------+--------------+  FV Distal None  Acute            +---------+---------------+---------+-----------+----------+--------------+  PFV       Full                                                             +---------+---------------+---------+-----------+----------+--------------+  POP       None            No        No                     Acute           +---------+---------------+---------+-----------+----------+--------------+  PTV       None                                             Acute           +---------+---------------+---------+-----------+----------+--------------+  PERO      None                                             Acute           +---------+---------------+---------+-----------+----------+--------------+  Gastroc   None                                             Acute           +---------+---------------+---------+-----------+----------+--------------+  EIV       None                                             Acute           +---------+---------------+---------+-----------+----------+--------------+  CIV                                                        Not visualized  +---------+---------------+---------+-----------+----------+--------------+     Summary: Right: No evidence of common femoral vein obstruction. Left: Findings consistent with acute deep vein thrombosis involving the left external iliac vein, common femoral vein, left femoral vein, left popliteal vein, left posterior tibial veins, left peroneal veins, and left gastrocnemius veins. Uanble to visualize common iliac vein.  *See table(s) above for measurements and observations. Electronically signed by Fabienne Brunsharles Fields MD on 10/06/2019 at 9:33:18 AM.    Final     Scheduled Meds:  potassium chloride  40 mEq Oral Q4H   Continuous Infusions:  dextrose 5 % lactated ringers with KCl/Additives Pediatric custom IV fluid 100 mL/hr at 10/06/19 0300   heparin 1,000 Units/hr (10/06/19 0300)     LOS: 0 days   Time spent: 32 minutes   Hughie Clossavi Mattison Stuckey, MD [facility]  10/06/2019, 1:33 PM   To contact the attending provider between 7A-7P or the covering provider during after hours 7P-7A, please log into the web site [email or web address] and use password [email or web address].

## 2019-10-06 NOTE — Progress Notes (Signed)
CRITICAL VALUE ALERT  Critical Value:  K <2.0  Date & Time Notied:  10/06/19 6:02 AM  Provider Notified: Hayden Pedro NP  Orders Received/Actions taken: No new orders at this time; continue IV fluids with potassium and oral potassium. Repeat labs at 1200. Nursing will continue to monitor.

## 2019-10-06 NOTE — Care Management (Signed)
Per Artist Pais. [place] amount for Xarelto 20 mg, 30 day or 90 day supply $.8.95. Co- pay amount for Eliquis 57m bid 30 day or 90 day supply $8.95.  No PA required Deductible no met. Tier 3 medications Pharmacies are: [facility].

## 2019-10-06 NOTE — Progress Notes (Signed)
RN has attempted 3 times today to get a urine sample. 1st time patient missed the hat in the toilet, 2nd time patient put toilet tissue in the hat, 3rd time patient had a BM with her urine

## 2019-10-06 NOTE — Progress Notes (Signed)
Interventional Radiology Procedure Note  68 yo female admitted with new diagnosis of LLE DVT, with positive duplex and positive CT venogram.   She tells me that the symptoms started last Saturday, and she denies any pain/discomfort.  She tells me that "her sister made her come" to the hospital because of the significant left leg swelling.   She is currently on heparin therapy.    Left leg is very asymmetric to the right, with significant swelling to the ankle/foot.  Warm and perfused, with pitting edema.  No pain to palpation.    She continues to deny any pain or discomfort, but she has not yet ambulated.  She currently does not have any compression stockings in place.   I discussed the concepts of catheter-directed lysis in the setting of ilio-femoral DVT.  Specifically, I told her that the procedure is indicated to decrease symptoms of post-thrombotic syndrome and improve quality of life.  The procedure has a risks: bleeding, infection, contrast reaction, kidney injury, need for ICU stay/extended hospitalization, neurologic compromise, GI bleeding, embolism, cardiopulmonary collapse, death.  I also let her know that there is a good chance that she would need a stent of any venous blockages/narrowing.    After our conversation, she is clearly very skeptical of any procedures, and voices to me that she only wants the standard of care blood thinners at this time.    I did let her know that we have up to 2-3 weeks to perform a lysis and possible stenting, and that after she begins ambulating she may notice increased symptoms.  If she wants to reconsider either on this admission or in the near future, I let her know that she stands to benefit from lysis.  Our clinic number, should she need it:  [phone]  I will discuss with her team.    Signed,  Dulcy Fanny. Earleen Newport, DO

## 2019-10-06 NOTE — Evaluation (Signed)
Physical Therapy Evaluation Patient Details Name: Jenna Kelley MRN: [id number] DOB: 1951/01/12 Today's Date: 10/06/2019   History of Present Illness  68 yo female admitted with new diagnosis of LLE DVT- on heparin.  Clinical Impression  Pt admitted with above diagnosis. Pt currently with functional limitations due to the deficits listed below (see PT Problem List). Pt will benefit from skilled PT to increase their independence and safety with mobility to allow discharge to the venue listed below.  Pt with slight posterior sway due to size of L LE and encouraged pt to use RW at home.  She verbalized understanding, but not sure she will be compliant.  She states she has access to a RW and BSC. She is going to her sister's house at time of d/c.  Will follow acutely, but no PT needed after d/c.     Follow Up Recommendations No PT follow up    Equipment Recommendations  None recommended by PT    Recommendations for Other Services       Precautions / Restrictions Precautions Precautions: Fall      Mobility  Bed Mobility Overal bed mobility: Modified Independent                Transfers Overall transfer level: Needs assistance Equipment used: Rolling walker (2 wheeled) Transfers: Sit to/from Stand Sit to Stand: Min guard;Mod assist         General transfer comment: min/guard from bed and MOD A from low toilet with cues for getting feet underneath her to decrease posterior sway  Ambulation/Gait Ambulation/Gait assistance: Supervision;Min guard Gait Distance (Feet): 160 Feet Assistive device: Rolling walker (2 wheeled) Gait Pattern/deviations: Decreased step length - right;Decreased step length - left     General Gait Details: Reliance on RW with decreased step length. denies pain with WB. Progressed from MIN/guard to S.  Stairs            Wheelchair Mobility    Modified Rankin (Stroke Patients Only)       Balance Overall balance assessment: Needs  assistance   Sitting balance-Leahy Scale: Good   Postural control: Posterior lean   Standing balance-Leahy Scale: Fair Standing balance comment: slight posterior sway upon standing and is safest with RW.                             Pertinent Vitals/Pain Pain Assessment: No/denies pain    Home Living Family/patient expects to be discharged to:: Private residence Living Arrangements: Alone(going to sister's house) Available Help at Discharge: Family;Available 24 hours/day   Home Access: Stairs to enter Entrance Stairs-Rails: Right;Left Entrance Stairs-Number of Steps: 3 Home Layout: One level Home Equipment: Cane - single point(say she has access to [facility] and RW) Additional Comments: going to sister's house which is house described above.    Prior Function Level of Independence: Independent               Hand Dominance        Extremity/Trunk Assessment   Upper Extremity Assessment Upper Extremity Assessment: Overall WFL for tasks assessed    Lower Extremity Assessment Lower Extremity Assessment: LLE deficits/detail;Overall WFL for tasks assessed LLE Deficits / Details: Significant swelling from DVT. Limits her knee ROM    Cervical / Trunk Assessment Cervical / Trunk Assessment: Normal  Communication   Communication: No difficulties  Cognition Arousal/Alertness: Awake/alert Behavior During Therapy: WFL for tasks assessed/performed Overall Cognitive Status: Within Functional Limits for tasks assessed  General Comments      Exercises     Assessment/Plan    PT Assessment Patient needs continued PT services  PT Problem List Decreased range of motion;Decreased balance;Decreased mobility;Decreased knowledge of use of DME       PT Treatment Interventions DME instruction;Gait training;Functional mobility training;Therapeutic activities;Therapeutic exercise;Balance training;Stair training     PT Goals (Current goals can be found in the Care Plan section)  Acute Rehab PT Goals Patient Stated Goal: go to her sister's house PT Goal Formulation: With patient Time For Goal Achievement: 10/20/19 Potential to Achieve Goals: Good    Frequency Min 3X/week   Barriers to discharge        Co-evaluation               AM-PAC PT "6 Clicks" Mobility  Outcome Measure Help needed turning from your back to your side while in a flat bed without using bedrails?: None Help needed moving from lying on your back to sitting on the side of a flat bed without using bedrails?: None Help needed moving to and from a bed to a chair (including a wheelchair)?: A Little Help needed standing up from a chair using your arms (e.g., wheelchair or bedside chair)?: A Little Help needed to walk in hospital room?: A Little Help needed climbing 3-5 steps with a railing? : A Little 6 Click Score: 20    End of Session Equipment Utilized During Treatment: Gait belt Activity Tolerance: Patient tolerated treatment well Patient left: in bed;with call bell/phone within reach;with bed alarm set Nurse Communication: Mobility status PT Visit Diagnosis: Unsteadiness on feet (R26.81);Other abnormalities of gait and mobility (R26.89)    Time: 1131-1206 PT Time Calculation (min) (ACUTE ONLY): 35 min   Charges:   PT Evaluation $PT Eval Low Complexity: 1 Low PT Treatments $Gait Training: 8-22 mins        Kalynne Womac L. Katrinka Blazing, [place] Pager [phone] 10/06/2019   Enzo Montgomery 10/06/2019, 12:25 PM

## 2019-10-07 LAB — GLUCOSE, CAPILLARY
Glucose-Capillary: 113 mg/dL — ABNORMAL HIGH (ref 70–99)
Glucose-Capillary: 107 mg/dL — ABNORMAL HIGH (ref 70–99)

## 2019-10-07 LAB — BASIC METABOLIC PANEL
Anion gap: 8 (ref 5–15)
BUN: <5 mg/dL — ABNORMAL LOW (ref 8–23)
CO2: 34 mmol/L — ABNORMAL HIGH (ref 22–32)
Calcium: 8.1 mg/dL — ABNORMAL LOW (ref 8.9–10.3)
Chloride: 105 mmol/L (ref 98–111)
Creatinine, Ser: 0.58 mg/dL (ref 0.44–1.00)
GFR calc Af Amer: >60 mL/min (ref >60)
GFR calc non Af Amer: >60 mL/min (ref >60)
Glucose, Bld: 95 mg/dL (ref 70–99)
Potassium: 2.9 mmol/L — ABNORMAL LOW (ref 3.5–5.1)
Sodium: 147 mmol/L — ABNORMAL HIGH (ref 135–145)

## 2019-10-07 LAB — CBC
HCT: 29.2 % — ABNORMAL LOW (ref 36.0–46.0)
Hemoglobin: 9.4 g/dL — ABNORMAL LOW (ref 12.0–15.0)
MCH: 28.9 pg (ref 26.0–34.0)
MCHC: 32.2 g/dL (ref 30.0–36.0)
MCV: 89.8 fL (ref 80.0–100.0)
Platelets: 257 10*3/uL (ref 150–400)
RBC: 3.25 MIL/uL — ABNORMAL LOW (ref 3.87–5.11)
RDW: 15.9 % — ABNORMAL HIGH (ref 11.5–15.5)
WBC: 3.9 10*3/uL — ABNORMAL LOW (ref 4.0–10.5)
nRBC: 0 % (ref 0.0–0.2)

## 2019-10-07 LAB — HEPARIN LEVEL (UNFRACTIONATED): Heparin Unfractionated: 0.62 IU/mL (ref 0.30–0.70)

## 2019-10-07 MED ORDER — POTASSIUM CHLORIDE CRYS ER 20 MEQ PO TBCR: 40.0000 meq | EXTENDED_RELEASE_TABLET | Freq: Every day | ORAL | 0 refills | Status: DC

## 2019-10-07 MED ORDER — TRAMADOL HCL 50 MG PO TABS: 50.0000 mg | ORAL_TABLET | Freq: Four times a day (QID) | ORAL | 0 refills | Status: DC | PRN

## 2019-10-07 MED ORDER — APIXABAN 5 MG PO TABS
10.0000 mg | ORAL_TABLET | Freq: Two times a day (BID) | ORAL | Status: DC
  Administered 2019-10-07: 10:00:00 10 mg via ORAL
  Filled 2019-10-07: qty 2

## 2019-10-07 MED ORDER — POTASSIUM CHLORIDE CRYS ER 20 MEQ PO TBCR: 60.0000 meq | EXTENDED_RELEASE_TABLET | Freq: Once | ORAL | Status: DC

## 2019-10-07 MED ORDER — APIXABAN (ELIQUIS) VTE STARTER PACK (10MG AND 5MG): ORAL_TABLET | ORAL | 0 refills | Status: DC

## 2019-10-07 MED ORDER — APIXABAN 5 MG PO TABS: 5.0000 mg | ORAL_TABLET | Freq: Two times a day (BID) | ORAL | Status: DC

## 2019-10-07 MED FILL — ELIQUIS STARTER PACK 5 MG T: 5 | 30 days supply | Qty: 74 | Fill #0

## 2019-10-07 MED FILL — traMADol HCL 50 MG TABS: 50 | 5 days supply | Qty: 20 | Fill #0

## 2019-10-07 NOTE — Progress Notes (Signed)
RN gave pt discharge instructions and she stated understanding TOC dropped of prescriptions.

## 2019-10-07 NOTE — Progress Notes (Addendum)
ANTICOAGULATION CONSULT NOTE - Follow Up Consult  Pharmacy Consult for IV Heparin Indication: DVT  No Known Allergies  Patient Measurements: Height: 5\' 6"  (167.6 cm) Weight: 134 lb (60.8 kg) IBW/kg (Calculated) : 59.3 Heparin Dosing Weight: 60.8 kg  Vital Signs: Temp: 98.3 F (36.8 C) (11/04 0429) Temp Source: Oral (11/04 0429) BP: 104/60 (11/04 0429) Pulse Rate: 58 (11/04 0429)  Labs: Recent Labs    10/05/19 1535  10/05/19 2216 10/06/19 0506 10/06/19 1614 10/07/19 0511  HGB  --    < > 11.5* 9.9*  --  9.4*  HCT  --   --  36.1 30.8*  --  29.2*  PLT  --   --  297 266  --  257  APTT 27  --   --   --   --   --   LABPROT 13.8  --   --   --   --   --   INR 1.1  --   --   --   --   --   HEPARINUNFRC  --   --  0.45 0.42  --  0.62  CREATININE  --    < > 0.66 0.61 0.71  --    < > = values in this interval not displayed.    Estimated Creatinine Clearance: 63 mL/min (by C-G formula based on SCr of 0.71 mg/dL).   Medications:  Infusions:  . heparin 1,000 Units/hr (10/06/19 0300)    Assessment: 68 year old female with DVT extending into external iliac vein on IV heparin.   Heparin level remains therapeutic at 0.62 on heparin drip rate of 1000 units/hr.  Hgb is 11.5>down to 9.4 today. Platelets remain within normal limits.  No bleeding reported.    Goal of Therapy:  Heparin level 0.3-0.7 units/ml Monitor platelets by anticoagulation protocol: Yes   Plan:  Continue Heparin at 1000 units/hr.  Daily Heparin level and CBC Follow-up plan for lysis/thrombectomy.  Graziella Connery A. Levada Dy, PharmD, BCPS, FNKF Clinical Pharmacist [place] Please utilize Amion for appropriate phone number to reach the unit pharmacist ([place])  Addendum:  MD wishes to change to apixaban. Will start apixaban 10mg  PO BID x 7 days, followed by 5mg  PO BID. Will d/c heparin once first dose of apixaban is given.    10/07/2019,7:29 AM

## 2019-10-07 NOTE — Discharge Summary (Signed)
Physician Discharge Summary  Jenna Kelley FWY:[id number] DOB: 04/08/1951 DOA: 10/05/2019  PCP: Lewis Moccasin, MD  Admit date: 10/05/2019 Discharge date: 10/07/2019  Admitted From: Home Disposition: Home  Recommendations for Outpatient Follow-up:  1. Follow up with PCP in 1 week with repeat CBC/BMP 2. Outpatient follow-up with IR if patient notices symptoms of increasing leg swelling and difficulty in ambulation 3. Follow up in ED if symptoms worsen or new appear   Home Health: No Equipment/Devices: None  Discharge Condition: Stable CODE STATUS: Full Diet recommendation: Heart healthy  Brief/Interim Summary: 68 year old female with history of supraumbilical hernia presented with left leg swelling for 4 days duration.  She was found to have extensive left lower extremity DVT and started on heparin drip.  IR was consulted for thrombectomy.  Patient opted for medical management at this time.  She will be switched to oral Eliquis and discharged home today.  Discharge Diagnoses:  Extensive left lower extremity DVT with large clot burden -Patient was started on heparin drip.  IR was consulted who recommended thrombolysis but patient declined that at this time.  If patient has increased symptoms of increasing leg swelling and difficulty in ambulation, she can still follow-up with IR as an outpatient within the next 2 weeks for possible thrombolysis and stent placement. -We will start Eliquis today and discharge the patient home on Eliquis.  Outpatient follow-up.  Severe hypokalemia -Questionable cause.  Potassium 2.9 today.  Will replace and patient will need oral potassium supplementation on discharge.  Follow-up BMP as outpatient.  Discharge Instructions  Discharge Instructions    Diet - low sodium heart healthy   Complete by: As directed    Increase activity slowly   Complete by: As directed      Allergies as of 10/07/2019   No Known Allergies     Medication List     STOP taking these medications   ciprofloxacin 500 MG tablet Commonly known as: CIPRO     TAKE these medications   Apixaban Starter Pack 5 MG Tbpk Commonly known as: ELIQUIS STARTER PACK Take as directed on package: start with two-5mg  tablets twice daily for 7 days. On day 8, switch to one-5mg  tablet twice daily.   multivitamin with minerals Tabs tablet Take 1 tablet by mouth daily.   potassium chloride SA 20 MEQ tablet Commonly known as: KLOR-CON Take 2 tablets (40 mEq total) by mouth daily for 7 days.   traMADol 50 MG tablet Commonly known as: Ultram Take 1 tablet (50 mg total) by mouth every 6 (six) hours as needed.       Follow-up Information    Lewis Moccasin, MD. Schedule an appointment as soon as possible for a visit in 1 week(s).   Specialty: Family Medicine Contact information: [address] [place] [address] [phone]          No Known Allergies  Consultations:  IR   Procedures/Studies: Ct Venogram Abd/pel  Result Date: 10/05/2019 CLINICAL DATA:  Large distention of left lower extremity with concern for DVT. EXAM: CT ABDOMEN AND PELVIS WITH CONTRAST TECHNIQUE: Multidetector CT imaging of the abdomen and pelvis was performed using the standard protocol following bolus administration of intravenous contrast. Venogram protocol is performed. The bilateral lower extremities were evaluated. CONTRAST:  OMNIPAQUE IOHEXOL 350 MG/ML SOLN COMPARISON:  None. FINDINGS: Lower chest: The lung bases are clear. The heart size is normal. Hepatobiliary: The liver is normal. Normal gallbladder.There is no biliary ductal dilation. Pancreas: Normal contours  without ductal dilatation. No peripancreatic fluid collection. Spleen: No splenic laceration or hematoma. Adrenals/Urinary Tract: --Adrenal glands: No adrenal hemorrhage. --Right kidney/ureter: No hydronephrosis or perinephric hematoma. --Left kidney/ureter: No hydronephrosis or perinephric hematoma.  --Urinary bladder: Unremarkable. Stomach/Bowel: --Stomach/Duodenum: No hiatal hernia or other gastric abnormality. Normal duodenal course and caliber. --Small bowel: No dilatation or inflammation. --Colon: There is a large amount of stool throughout the colon. There is an abdominal wall hernia containing a loop of the transverse colon. The colon leading into the hernia demonstrates wall thickening. There is no significant free fluid in the hernia sac. There is no pneumatosis. --Appendix: Normal. Vascular/Lymphatic: Atherosclerotic changes are noted throughout the abdominal aorta without evidence for an abdominal aortic aneurysm. There is an extensive DVT involving the left lower extremity extending from the left external iliac vein through the popliteal veins and into the tibial venous structures. The thrombus is likely occlusive to near occlusive. The DVT partially extends into the left common iliac vein but does not clearly extend into the IVC. There is no evidence for a DVT involving the right lower extremity. The lower extremity arterial vasculature is grossly patent without evidence for high-grade stenosis. Evaluation is somewhat limited by contrast bolus timing. --No retroperitoneal lymphadenopathy. --No mesenteric lymphadenopathy. --No pelvic or inguinal lymphadenopathy. Reproductive: Unremarkable Other: No ascites or free air. There is extensive asymmetric left lower extremity soft tissue edema and swelling. This is almost certainly due to the patient's extensive lower extremity DVT. Musculoskeletal. There is no displaced fracture. No dislocation. There are end-stage degenerative changes of the right hip. There are advanced degenerative changes of both knees, greatest within the lateral compartments bilaterally. There are small to moderate-sized bilateral suprapatellar joint effusions. IMPRESSION: 1. Extensive acute appearing deep vein thrombosis involving the left lower extremity, from the left common iliac  vein through the tibial veins. There is extensive associated asymmetric left lower extremity swelling and edema. The thrombus does not appear to extend into the IVC. 2. Large ventral wall hernia containing a loop of transverse colon. There is no obstruction, however there is wall thickening of the transverse colon leading up to the hernia. This may represent early strangulation or incarceration. Other differential considerations include a focal colitis. An underlying mass cannot be excluded. Consider outpatient colonoscopy as clinically indicated. These results were called by telephone at the time of interpretation on 10/05/2019 at 11:20 pm to provider Eduard Rouxatalina Eubanks, who verbally acknowledged these results. Electronically Signed   By: Katherine Mantlehristopher  Green M.D.   On: 10/05/2019 23:17   Vas [place]as Lower Extremity Venous (dvt) (only [facility])  Result Date: 10/06/2019  Lower Venous Study Indications: Edema.  Limitations: Abdmonial gas and body habitus. Comparison Study: No prior study on file for comparison Performing Technologist: Sherren Kernsandace Kanady RVS  Examination Guidelines: A complete evaluation includes B-mode imaging, spectral Doppler, color Doppler, and power Doppler as needed of all accessible portions of each vessel. Bilateral testing is considered an integral part of a complete examination. Limited examinations for reoccurring indications may be performed as noted.  +-----+---------------+---------+-----------+----------+--------------+ RIGHTCompressibilityPhasicitySpontaneityPropertiesThrombus Aging +-----+---------------+---------+-----------+----------+--------------+ CFV  Full           Yes      Yes                                 +-----+---------------+---------+-----------+----------+--------------+   +---------+---------------+---------+-----------+----------+--------------+ LEFT     CompressibilityPhasicitySpontaneityPropertiesThrombus Aging  +---------+---------------+---------+-----------+----------+--------------+ CFV      None  No       No                   Acute          +---------+---------------+---------+-----------+----------+--------------+ SFJ      None                                         Acute          +---------+---------------+---------+-----------+----------+--------------+ FV Prox  None                                         Acute          +---------+---------------+---------+-----------+----------+--------------+ FV Mid   None                                         Acute          +---------+---------------+---------+-----------+----------+--------------+ FV DistalNone                                         Acute          +---------+---------------+---------+-----------+----------+--------------+ PFV      Full                                                        +---------+---------------+---------+-----------+----------+--------------+ POP      None           No       No                   Acute          +---------+---------------+---------+-----------+----------+--------------+ PTV      None                                         Acute          +---------+---------------+---------+-----------+----------+--------------+ PERO     None                                         Acute          +---------+---------------+---------+-----------+----------+--------------+ Gastroc  None                                         Acute          +---------+---------------+---------+-----------+----------+--------------+ EIV      None                                         Acute          +---------+---------------+---------+-----------+----------+--------------+  CIV                                                   Not visualized +---------+---------------+---------+-----------+----------+--------------+     Summary: Right: No evidence of common femoral  vein obstruction. Left: Findings consistent with acute deep vein thrombosis involving the left external iliac vein, common femoral vein, left femoral vein, left popliteal vein, left posterior tibial veins, left peroneal veins, and left gastrocnemius veins. Uanble to visualize common iliac vein.  *See table(s) above for measurements and observations. Electronically signed by Ruta Hinds MD on 10/06/2019 at 9:33:18 AM.    Final        Subjective: Patient seen and examined at bedside.  She denies worsening lower extremity pain.  No overnight fever or vomiting.  Poor historian.  Discharge Exam: Vitals:   10/06/19 2327 10/07/19 0429  BP: (!) 100/49 104/60  Pulse: 65 (!) 58  Resp: 18 20  Temp: 98.1 F (36.7 C) 98.3 F (36.8 C)  SpO2: 99% 98%    General: Pt is alert, awake, not in acute distress.  Poor historian. Cardiovascular: Intermittent bradycardia, S1/S2 + Respiratory: bilateral decreased breath sounds at bases Abdominal: Soft, NT, ND, bowel sounds + Extremities: Left lower extremity swelling present, no cyanosis    The results of significant diagnostics from this hospitalization (including imaging, microbiology, ancillary and laboratory) are listed below for reference.     Microbiology: Recent Results (from the past 240 hour(s))  Novel Coronavirus, NAA ([facility])     Status: None   Collection Time: 09/28/19 12:00 AM   Specimen: Oropharyngeal(OP) collection in vial transport medium   OROPHARYNGEA  TESTING  Result Value Ref Range Status   SARS-CoV-2, NAA Not Detected Not Detected Final    Comment: Testing was performed using the cobas(R) SARS-CoV-2 test. This nucleic acid amplification test was developed and its performance characteristics determined by [employer]. Nucleic acid amplification tests include PCR and TMA. This test has not been FDA cleared or approved. This test has been authorized by FDA under an Emergency Use Authorization (EUA). This test is only  authorized for the duration of time the declaration that circumstances exist justifying the authorization of the emergency use of in vitro diagnostic tests for detection of SARS-CoV-2 virus and/or diagnosis of COVID-19 infection under section 564(b)(1) of the Act, 21 U.S.C. 956OZH-0(Q) (1), unless the authorization is terminated or revoked sooner. When diagnostic testing is negative, the possibility of a false negative result should be considered in the context of a patient's recent exposures and the presence of clinical signs and symptoms consistent with COVID-19. An individual without symptoms  of COVID-19 and who is not shedding SARS-CoV-2 virus would expect to have a negative (not detected) result in this assay.   SARS CORONAVIRUS 2 (TAT 6-24 HRS) Nasopharyngeal Nasopharyngeal Swab     Status: None   Collection Time: 10/05/19  1:50 PM   Specimen: Nasopharyngeal Swab  Result Value Ref Range Status   SARS Coronavirus 2 NEGATIVE NEGATIVE Final    Comment: (NOTE) SARS-CoV-2 target nucleic acids are NOT DETECTED. The SARS-CoV-2 RNA is generally detectable in upper and lower respiratory specimens during the acute phase of infection. Negative results do not preclude SARS-CoV-2 infection, do not rule out co-infections with other pathogens, and should not be used as the sole basis for treatment or other patient management decisions.  Negative results must be combined with clinical observations, patient history, and epidemiological information. The expected result is Negative. Fact Sheet for Patients: HairSlick.no Fact Sheet for Healthcare Providers: quierodirigir.com This test is not yet approved or cleared by the [place] FDA and  has been authorized for detection and/or diagnosis of SARS-CoV-2 by FDA under an Emergency Use Authorization (EUA). This EUA will remain  in effect (meaning this test can be used) for the duration of  the COVID-19 declaration under Section 56 4(b)(1) of the Act, 21 U.S.C. section 360bbb-3(b)(1), unless the authorization is terminated or revoked sooner. Performed at [facility], [address], [place], [place] [address]      Labs: BNP (last 3 results) No results for input(s): BNP in the last 8760 hours. Basic Metabolic Panel: Recent Labs  Lab 10/05/19 1700 10/05/19 2216 10/06/19 0506 10/06/19 1614 10/07/19 0830  NA 143 143 144 146* 147*  K <2.0* <2.0* <2.0* 2.3* 2.9*  CL 98 94* 100 100 105  CO2 37* 37* 34* 34* 34*  GLUCOSE 104* 118* 99 110* 95  BUN <5* <5* <5* <5* <5*  CREATININE 0.65 0.66 0.61 0.71 0.58  CALCIUM 8.1* 8.5* 7.9* 8.0* 8.1*  MG  --  2.2 2.2  --   --   PHOS  --  2.7 2.6  --   --    Liver Function Tests: Recent Labs  Lab 10/05/19 2216 10/06/19 0506  AST 47* 42*  ALT 46* 38  ALKPHOS 72 55  BILITOT 1.0 0.6  PROT 6.0* 5.0*  ALBUMIN 2.2* 1.8*   No results for input(s): LIPASE, AMYLASE in the last 168 hours. No results for input(s): AMMONIA in the last 168 hours. CBC: Recent Labs  Lab 10/05/19 2216 10/06/19 0506 10/07/19 0511  WBC 4.1 4.5 3.9*  NEUTROABS 2.2  --   --   HGB 11.5* 9.9* 9.4*  HCT 36.1 30.8* 29.2*  MCV 90.9 90.6 89.8  PLT 297 266 257   Cardiac Enzymes: No results for input(s): CKTOTAL, CKMB, CKMBINDEX, TROPONINI in the last 168 hours. BNP: Invalid input(s): POCBNP CBG: Recent Labs  Lab 10/06/19 1141 10/06/19 1859 10/07/19 0151 10/07/19 0545  GLUCAP 87 116* 113* 107*   D-Dimer No results for input(s): DDIMER in the last 72 hours. Hgb A1c No results for input(s): HGBA1C in the last 72 hours. Lipid Profile No results for input(s): CHOL, HDL, LDLCALC, TRIG, CHOLHDL, LDLDIRECT in the last 72 hours. Thyroid function studies Recent Labs    10/06/19 0506  TSH 0.684   Anemia work up No results for input(s): VITAMINB12, FOLATE, FERRITIN, TIBC, IRON, RETICCTPCT in the last 72 hours. Urinalysis No results found  for: COLORURINE, APPEARANCEUR, LABSPEC, PHURINE, GLUCOSEU, HGBUR, BILIRUBINUR, KETONESUR, PROTEINUR, UROBILINOGEN, NITRITE, LEUKOCYTESUR Sepsis Labs Invalid input(s): PROCALCITONIN,  WBC,  LACTICIDVEN Microbiology Recent Results (from the past 240 hour(s))  Novel Coronavirus, NAA ([facility])     Status: None   Collection Time: 09/28/19 12:00 AM   Specimen: Oropharyngeal(OP) collection in vial transport medium   OROPHARYNGEA  TESTING  Result Value Ref Range Status   SARS-CoV-2, NAA Not Detected Not Detected Final    Comment: Testing was performed using the cobas(R) SARS-CoV-2 test. This nucleic acid amplification test was developed and its performance characteristics determined by [employer]. Nucleic acid amplification tests include PCR and TMA. This test has not been FDA cleared or approved. This test has been authorized by FDA under an Emergency Use Authorization (EUA). This test is only authorized for the duration  of time the declaration that circumstances exist justifying the authorization of the emergency use of in vitro diagnostic tests for detection of SARS-CoV-2 virus and/or diagnosis of COVID-19 infection under section 564(b)(1) of the Act, 21 U.S.C. 161WRU-0(A) (1), unless the authorization is terminated or revoked sooner. When diagnostic testing is negative, the possibility of a false negative result should be considered in the context of a patient's recent exposures and the presence of clinical signs and symptoms consistent with COVID-19. An individual without symptoms  of COVID-19 and who is not shedding SARS-CoV-2 virus would expect to have a negative (not detected) result in this assay.   SARS CORONAVIRUS 2 (TAT 6-24 HRS) Nasopharyngeal Nasopharyngeal Swab     Status: None   Collection Time: 10/05/19  1:50 PM   Specimen: Nasopharyngeal Swab  Result Value Ref Range Status   SARS Coronavirus 2 NEGATIVE NEGATIVE Final    Comment: (NOTE) SARS-CoV-2 target  nucleic acids are NOT DETECTED. The SARS-CoV-2 RNA is generally detectable in upper and lower respiratory specimens during the acute phase of infection. Negative results do not preclude SARS-CoV-2 infection, do not rule out co-infections with other pathogens, and should not be used as the sole basis for treatment or other patient management decisions. Negative results must be combined with clinical observations, patient history, and epidemiological information. The expected result is Negative. Fact Sheet for Patients: HairSlick.no Fact Sheet for Healthcare Providers: quierodirigir.com This test is not yet approved or cleared by the [place] FDA and  has been authorized for detection and/or diagnosis of SARS-CoV-2 by FDA under an Emergency Use Authorization (EUA). This EUA will remain  in effect (meaning this test can be used) for the duration of the COVID-19 declaration under Section 56 4(b)(1) of the Act, 21 U.S.C. section 360bbb-3(b)(1), unless the authorization is terminated or revoked sooner. Performed at [facility], [address], [place], [place] [address]      Time coordinating discharge: 35 minutes  SIGNED:   Glade Lloyd, MD  [facility] 10/07/2019, 11:09 AM

## 2019-10-07 NOTE — Discharge Instructions (Signed)
Information on my medicine - ELIQUIS (apixaban)  This medication education was reviewed with me or my healthcare representative as part of my discharge preparation.    Why was Eliquis prescribed for you? Eliquis was prescribed to treat blood clots that may have been found in the veins of your legs (deep vein thrombosis) or in your lungs (pulmonary embolism) and to reduce the risk of them occurring again.  What do You need to know about Eliquis ? The starting dose is 10 mg (two 5 mg tablets) taken TWICE daily for the FIRST SEVEN (7) DAYS, then on November 11th  the dose is reduced to ONE 5 mg tablet taken TWICE daily.  Eliquis may be taken with or without food.   Try to take the dose about the same time in the morning and in the evening. If you have difficulty swallowing the tablet whole please discuss with your pharmacist how to take the medication safely.  Take Eliquis exactly as prescribed and DO NOT stop taking Eliquis without talking to the doctor who prescribed the medication.  Stopping may increase your risk of developing a new blood clot.  Refill your prescription before you run out.  After discharge, you should have regular check-up appointments with your healthcare provider that is prescribing your Eliquis.    What do you do if you miss a dose? If a dose of ELIQUIS is not taken at the scheduled time, take it as soon as possible on the same day and twice-daily administration should be resumed. The dose should not be doubled to make up for a missed dose.  Important Safety Information A possible side effect of Eliquis is bleeding. You should call your healthcare provider right away if you experience any of the following: ? Bleeding from an injury or your nose that does not stop. ? Unusual colored urine (red or dark brown) or unusual colored stools (red or black). ? Unusual bruising for unknown reasons. ? A serious fall or if you hit your head (even if there is no  bleeding).  Some medicines may interact with Eliquis and might increase your risk of bleeding or clotting while on Eliquis. To help avoid this, consult your healthcare provider or pharmacist prior to using any new prescription or non-prescription medications, including herbals, vitamins, non-steroidal anti-inflammatory drugs (NSAIDs) and supplements.  This website has more information on Eliquis (apixaban): http://www.eliquis.com/eliquis/home

## 2019-10-09 ENCOUNTER — Other Ambulatory Visit: Payer: Self-pay

## 2019-10-09 NOTE — Patient Outreach (Addendum)
[facility]) Care Management  10/09/2019   Brookelynn Hamor [birth year] [id number]   Initial Assessment  Transition of Care Referral  Referral Date: 10/09/2019 Referral Source: [facility] Discharge Report Date of Admission:10/05/2019 Diagnosis: DVT Date of Discharge: 10/07/2019 Facility: [facility]    Outreach attempt #1 to patient. Spoke with patient who reports she is out and about riding around with her sister. She deniesany acute issues or concerns at this time. She voices that she is temporarily staying with her sister while she recovers from recent hospitalization. She is ambulatory with use of cane or walker. She states that she is fairly independent with ADLs/IADLs.  Conditions: Per chart reviewed, patient has PMH that includes hernia. She was hospitalized for a DVT. She currently taking Eliquis therapy. Patient denies any pain and states she has not had to take any pain meds since returning home. Appetite remains good. She is on a regular diet. Her potassium level was low while in the hospital and RN CM reviewed foods high in potassium.   Appointments; Patient goes for PCP follow up appt on 10/14/2019. She voices that her sister will be transporting her to appt.  Medications: Med review completed with patient. No issues noted. She denies any issues affording and/or managing meds at this time.   Current Medications:  Current Outpatient Medications  Medication Sig Dispense Refill  . Apixaban Starter Pack (ELIQUIS STARTER PACK) 5 MG TBPK Take as directed on package: start with two-5mg  tablets twice daily for 7 days. On day 8, switch to one-5mg  tablet twice daily. 1 each 0  . Multiple Vitamin (MULTIVITAMIN WITH MINERALS) TABS tablet Take 1 tablet by mouth daily.    . potassium chloride SA (KLOR-CON) 20 MEQ tablet Take 2 tablets (40 mEq total) by mouth daily for 7 days. 14 tablet 0  . traMADol (ULTRAM) 50 MG tablet Take 1 tablet  (50 mg total) by mouth every 6 (six) hours as needed. 20 tablet 0   No current facility-administered medications for this visit.     Functional Status:  In your present state of health, do you have any difficulty performing the following activities: 10/09/2019 10/05/2019  Hearing? N N  Vision? N N  Difficulty concentrating or making decisions? N N  Walking or climbing stairs? N N  Dressing or bathing? N N  Doing errands, shopping? N N  Preparing Food and eating ? N -  Using the Toilet? N -  In the past six months, have you accidently leaked urine? N -  Do you have problems with loss of bowel control? N -  Managing your Medications? N -  Managing your Finances? N -  Housekeeping or managing your Housekeeping? N -  Some recent data might be hidden    Fall/Depression Screening: Fall Risk  10/09/2019  Falls in the past year? 0  Number falls in past yr: 0  Injury with Fall? 0  Risk for fall due to : Impaired balance/gait;Medication side effect  Follow up Falls evaluation completed;Education provided   PHQ 2/9 Scores 10/09/2019  PHQ - 2 Score 0    Consent:THN services reviewed and discussed with patient. Verbal consent for services given.  Assessment:  THN CM Care Plan Problem One     Most Recent Value  Care Plan Problem One  Patient at risk for hospital readmission.  Role Documenting the Problem One  Care Management Telephonic Coordinator  Care Plan for Problem One  Active  [facility] Long Term Goal  Patient will have no hosptial readmissions within the next 31 days.  THN Long Term Goal Start Date  10/09/19  Interventions for Problem One Long Term Goal  RN CM assessed for any acute issues/cocnerns. RN CM reviewed action plan and when to seek medical attention. RN CM confirmed that pt. has MD contact info and knows when,how and why to contact them.   THN CM Short Term Goal #1   Patient will report increas/return to normal potassium level over the next 30 days.  THN CM Short Term Goal  #1 Start Date  10/09/19  Interventions for Short Term Goal #1  RN CM reviewed with pt. importance of adhering to med regimen. RN CM discussed with pt. foods high in potassium.  THN CM Short Term Goal #2   Patient will complete post disharge MD follow up appt. over the next 30 days.  THN CM Short Term Goal #2 Start Date  10/09/19  Interventions for Short Term Goal #2  RN CM confirmed MD appt in place. RN CM assessed for any barriers to getting to appt.  THN CM Short Term Goal #3  Patient will report med adherence to blood thinner regimen 100% over the next 30 days.  THN CM Short Term Goal #3 Start Date  10/09/19  Interventions for Short Tern Goal #3  RNCM completed med review with pt. RN CM assessed for any barriers to med adherence.        Plan:  RN CM discussed with patient next outreach within a week. Patient gave verbal consent and in agreement with RN CM follow up timeframe. Patient aware that they may contact RN CM sooner for any issues or concerns. RN CM will send welcome letter to patient. RN CM will send barriers letter and route encounter to PCP.     Antionette Fairy, RN,BSN,CCM [facility] Care Management Telephonic Care Management Coordinator Direct Phone: [phone] Toll Free: [phone] Fax: [phone]

## 2019-10-12 DIAGNOSIS — Z1159 Encounter for screening for other viral diseases: Secondary | ICD-10-CM | POA: Diagnosis not present

## 2019-10-14 ENCOUNTER — Other Ambulatory Visit: Payer: Self-pay

## 2019-10-14 DIAGNOSIS — I82409 Acute embolism and thrombosis of unspecified deep veins of unspecified lower extremity: Secondary | ICD-10-CM | POA: Diagnosis not present

## 2019-10-14 DIAGNOSIS — K429 Umbilical hernia without obstruction or gangrene: Secondary | ICD-10-CM | POA: Diagnosis not present

## 2019-10-14 DIAGNOSIS — R197 Diarrhea, unspecified: Secondary | ICD-10-CM | POA: Diagnosis not present

## 2019-10-14 NOTE — Patient Outreach (Addendum)
[facility]) Care Management  10/14/2019  Jenna Kelley 1951-11-09 [id number]   Transition of Care Week #2     Outreach attempt #1 to patient. Spoke with patient. She denies any acute issue or concerns. She voices she is doing well and feels like she is getting stronger and better. She goes for PCP follow up appt this afternoon. Her sister will be taking he to appt. She is scheduled for mammogram tomorrow and patient inquiring if she should keep appt. RN CM discussed with pt the importance of annual exam and encouraged her to keep appt. Patient voices that she continues to stay with her sister during recovery but is also considering staying there more permanently as she is realizing more and more that she does not want to live alone at her age. Patient reports that she has completed initial dosaging of Eliquis and today starts to new dosage. RN CM reviewed med dosage and instructions with both patient and her sister. Eliquis is ordered 5mg  BID. Her sister is inquiring if pt can just take two tabs in the morning instead and be done with med for the day. Advised them to discuss this with PCP during appt today. They voiced understanding and will follow up. Patient denies any further RN CM needs or concerns at this time.   [facility] CM Care Plan Problem One     Most Recent Value  Care Plan Problem One  Patient at risk for hospital readmission.  Role Documenting the Problem One  Care Management Telephonic Coordinator  Care Plan for Problem One  Active  [facility] Long Term Goal   Patient will have no hosptial readmissions within the next 31 days.  THN Long Term Goal Start Date  10/09/19  Interventions for Problem One Long Term Goal  RN CM assessed for any acute issues or concerns. RN CM reviewd with pt action plan for worsening s/s and when to seek medical attention.  THN CM Short Term Goal #1   Patient will report increas/return to normal potassium level over the next 30 days.  THN CM  Short Term Goal #1 Start Date  10/09/19  Interventions for Short Term Goal #1  RN CM assessed that pt completed therapy. RN CM reviewed with pt foods to eat to increase level.  THN CM Short Term Goal #2   Patient will complete post disharge MD follow up appt. over the next 30 days.  THN CM Short Term Goal #2 Start Date  10/09/19  [facility] CM Short Term Goal #3  Patient will report med adherence to blood thinner regimen 100% over the next 30 days.  THN CM Short Term Goal #3 Start Date  10/09/19  Interventions for Short Tern Goal #3  RN CM reviewd and discussed med dosageing with pt and sister. RN CM assessed for any SEs from therapy.    [facility] CM Care Plan Problem Two     Most Recent Value  Care Plan Problem Two  Helath maintenance-patient due for mammogram.  Role Documenting the Problem Two  Care Management Telephonic Coordinator  Care Plan for Problem Two  Active  THN CM Short Term Goal #1   Patient will complete mammogram screening within the next 30 days.  THN CM Short Term Goal #1 Start Date  10/14/19  Interventions for Short Term Goal #2   RN CM discussed with pt the importance of screening. RN CM confirmed that pt has an appt for screening exam and no barriers.  Plan: RN CM discussed with patient next outreach within a week. Patient gave verbal consent and in agreement with RN CM follow up timeframe. Patient aware that they may contact RN CM sooner for any issues or concerns.   Enzo Montgomery, RN,BSN,CCM [place] Management Telephonic Care Management Coordinator Direct Phone: [phone] Toll Free: [phone] Fax: [phone]

## 2019-10-15 ENCOUNTER — Other Ambulatory Visit: Payer: Self-pay

## 2019-10-15 ENCOUNTER — Encounter: Payer: Self-pay | Admitting: Gastroenterology

## 2019-10-15 ENCOUNTER — Ambulatory Visit
Admission: RE | Admit: 2019-10-15 | Discharge: 2019-10-15 | Disposition: A | Discharge status: Home / Self Care | Payer: Medicare HMO | Source: Ambulatory Visit | Attending: Family Medicine | Admitting: Family Medicine

## 2019-10-15 DIAGNOSIS — Z1231 Encounter for screening mammogram for malignant neoplasm of breast: Secondary | ICD-10-CM | POA: Diagnosis not present

## 2019-10-16 ENCOUNTER — Ambulatory Visit: Payer: Medicare HMO

## 2019-10-21 ENCOUNTER — Other Ambulatory Visit: Payer: Self-pay

## 2019-10-21 NOTE — Patient Outreach (Signed)
[facility]) Care Management  10/21/2019  Labrisha Wuellner 07-22-1951 [id number]   Telephone Assessment   Outreach attempt #1 to patient. Spoke with patient who denies any acute issues or concerns. Patient celebrated her birthday a few days. She was happy to share how much her family and friends loved on her and she got so many gifts and cards. Patient went for PCP follow up appt last week. Per patient report, appt went well and no changes made. Patient states she did complete her mammogram appt as well. She denies any RN CM needs or concerns at present. Patient has completed short term TOC program.   [facility] CM Care Plan Problem One     Most Recent Value  Care Plan Problem One  Patient at risk for hospital readmission.  Role Documenting the Problem One  Care Management Telephonic [place] for Problem One  Active  [facility] Long Term Goal   Patient will have no hosptial readmissions within the next 31 days.  THN Long Term Goal Start Date  10/09/19  Interventions for Problem One Long Term Goal  RN CM assessed for any adverse issues or concerns. RN CM reinforced to pt. s/s of worsening condition and when to seek medical attention.  THN CM Short Term Goal #1   Patient will report increas/return to normal potassium level over the next 30 days.  THN CM Short Term Goal #1 Start Date  10/09/19  Interventions for Short Term Goal #1  RN CM continued education with pt on importance of supplement replacement  THN CM Short Term Goal #2   Patient will complete post disharge MD follow up appt. over the next 30 days.  THN CM Short Term Goal #2 Start Date  10/09/19  THN CM Short Term Goal #2 Met Date  10/21/19  THN CM Short Term Goal #3  Patient will report med adherence to blood thinner regimen 100% over the next 30 days.  THN CM Short Term Goal #3 Start Date  10/09/19    [facility] CM Care Plan Problem Two     Most Recent Value  Care Plan Problem Two  Helath maintenance-patient  due for mammogram.  Role Documenting the Problem Two  Care Management Telephonic Coordinator  Care Plan for Problem Two  Active  THN CM Short Term Goal #1   Patient will complete mammogram screening within the next 30 days.  THN CM Short Term Goal #1 Start Date  10/14/19      Plan: RN CM discussed with patient next outreach within a month. Patient gave verbal consent and in agreement with RN CM follow up timeframe. Patient aware that they may contact RN CM sooner for any issues or concerns.  Enzo Montgomery, RN,BSN,CCM [place] Management Telephonic Care Management Coordinator Direct Phone: [phone] Toll Free: [phone] Fax: [phone]

## 2019-10-28 DIAGNOSIS — I82409 Acute embolism and thrombosis of unspecified deep veins of unspecified lower extremity: Secondary | ICD-10-CM | POA: Diagnosis not present

## 2019-11-06 ENCOUNTER — Other Ambulatory Visit: Payer: Self-pay

## 2019-11-06 ENCOUNTER — Encounter: Payer: Self-pay | Admitting: Gastroenterology

## 2019-11-06 ENCOUNTER — Ambulatory Visit (INDEPENDENT_AMBULATORY_CARE_PROVIDER_SITE_OTHER): Payer: Medicare HMO | Admitting: Gastroenterology

## 2019-11-06 VITALS — BP 120/70 | HR 76 | Temp 98.4°F | Ht 64.5 in | Wt 154.2 lb

## 2019-11-06 DIAGNOSIS — K439 Ventral hernia without obstruction or gangrene: Secondary | ICD-10-CM

## 2019-11-06 NOTE — Patient Instructions (Signed)
You have been scheduled for an appointment with _______________ at [facility]. Your appointment is on _________________ at __________________. Please arrive at ________________ for registration. Make certain to bring a list of current medications, including any over the counter medications or vitamins. Also bring your co-pay if you have one as well as your insurance cards. [facility] is located at [address], [address]. Should you need to reschedule your appointment, please contact them at [phone].    I appreciate the opportunity to care for you. Owens Loffler, MD

## 2019-11-06 NOTE — Progress Notes (Signed)
HPI: This is a very pleasant 68 year old woman who was referred to me by Lewis Moccasin, MD  to consider colonoscopy for colon cancer screening.    She is here with her sister today.  Last month she had swollen left leg and presented for evaluation.  Testing showed a large left-sided lower extremity DVT and she was started on blood thinners.  She will probably be on those blood thinners for at least 3 to 6 months.  She was also incidentally found to have a ventral hernia containing a loop of transverse colon during that work-up.  She has no issues with her bowels.  No overt bleeding, no significant diarrhea or constipation.  She has felt a lump around her umbilicus for many years but she has never really been too concerned about it.  Colon cancer does not run in her family  She has never had colon cancer screening that she is aware of  Old Data Reviewed: Blood work November 2020 at admission hemoglobin 11.5, at discharge hemoglobin 9.4 CT venogram November 2020 "extensive acute appearing DVT involving the left lower extremity from the common iliac vein through the tibial veins.  There is extensive associated asymmetric left lower extremity swelling edema.  The thrombus does not appear to extend into the IVC.  Also a large ventral wall hernia containing a loop of transverse colon.  There is no obstruction, however there is wall thickening of the transverse colon leading up to the hernia.  This may represent early strangulation or incarceration.  An underlying mass cannot be excluded.   Review of systems: Pertinent positive and negative review of systems were noted in the above HPI section. All other review negative.   Past Medical History:  Diagnosis Date  . Cataract   . DVT (deep venous thrombosis) (HCC)    left leg  . Osteoarthritis    shoulder  . Umbilical hernia     Past Surgical History:  Procedure Laterality Date  . NO PAST SURGERIES      Current Outpatient Medications   Medication Sig Dispense Refill  . Apixaban Starter Pack (ELIQUIS STARTER PACK) 5 MG TBPK Take as directed on package: start with two-5mg  tablets twice daily for 7 days. On day 8, switch to one-5mg  tablet twice daily. 1 each 0  . Multiple Vitamin (MULTIVITAMIN WITH MINERALS) TABS tablet Take 1 tablet by mouth daily.     No current facility-administered medications for this visit.     Allergies as of 11/06/2019  . (No Known Allergies)    Family History  Problem Relation Age of Onset  . Diabetes Mother   . Diabetes Sister   . Hypertension Sister   . Lung cancer Sister   . Breast cancer Neg Hx     Social History   Socioeconomic History  . Marital status: Widowed    Spouse name: Not on file  . Number of children: 0  . Years of education: Not on file  . Highest education level: Not on file  Occupational History  . Not on file  Social Needs  . Financial resource strain: Not very hard  . Food insecurity    Worry: Never true    Inability: Never true  . Transportation needs    Medical: No    Non-medical: No  Tobacco Use  . Smoking status: Never Smoker  . Smokeless tobacco: Never Used  Substance and Sexual Activity  . Alcohol use: No  . Drug use: Not Currently  . Sexual activity: Not  Currently  Lifestyle  . Physical activity    Days per week: Not on file    Minutes per session: Not on file  . Stress: Not on file  Relationships  . Social Herbalist on phone: Not on file    Gets together: Not on file    Attends religious service: Not on file    Active member of club or organization: Not on file    Attends meetings of clubs or organizations: Not on file    Relationship status: Not on file  . Intimate partner violence    Fear of current or ex partner: Not on file    Emotionally abused: Not on file    Physically abused: Not on file    Forced sexual activity: Not on file  Other Topics Concern  . Not on file  Social History Narrative  . Not on file      Physical Exam: Temp 98.4 F (36.9 C)   Ht 5' 4.5" (1.638 m) Comment: height measured without shoes  Wt 154 lb 4 oz (70 kg)   BMI 26.07 kg/m  Constitutional: generally well-appearing Psychiatric: alert and oriented x3 Eyes: extraocular movements intact Mouth: oral pharynx moist, no lesions Neck: supple no lymphadenopathy Cardiovascular: heart regular rate and rhythm Lungs: clear to auscultation bilaterally Abdomen: soft, nontender, nondistended, no obvious ascites, no peritoneal signs, normal bowel sounds; obvious soft 4 to 8 cm abdominal wall hernia that I could not clearly reduce.  It is not tender. Extremities: no lower extremity edema bilaterally Skin: no lesions on visible extremities   Assessment and plan: 68 y.o. female with routine risk for colon cancer  2 significant issues here with regards to her undergoing a colonoscopy for colon cancer screening.  First she was just started on a blood thinner for acute DVT last month.  She will probably be on that blood thinner for at least 3 to 6 months.  The benefits would not outweigh the risk for her to stop it electively for a screening examination for colon cancer.  Second she has a ventral abdominal hernia containing a pretty sizable loop of transverse colon.  I cannot easily reduce it by manual manipulation.  I do not think that this would allow for safe colonoscopy attempt until it is repaired surgically which it should be even if it is asymptomatic since it contains colon.  I am going to refer her to a general surgeon to consider this.  After that evaluation, probable eventual surgery, I am happy to see her back to consider colon cancer screening.   Please see the "Patient Instructions" section for addition details about the plan.   Owens Loffler, MD [facility] 11/06/2019, 11:00 AM  Cc: Fanny Bien, MD

## 2019-11-11 DIAGNOSIS — I82409 Acute embolism and thrombosis of unspecified deep veins of unspecified lower extremity: Secondary | ICD-10-CM | POA: Diagnosis not present

## 2019-11-12 ENCOUNTER — Other Ambulatory Visit: Payer: Self-pay

## 2019-11-12 NOTE — Patient Outreach (Signed)
[facility]) Care Management  11/12/2019  Karle Desrosier 1951-05-15 [id number]   Telephone Assessment    Outreach attempt #1 to patient. Spoke with patient who denies any acute issues or concerns at present. She shares that she went to see GI specialist recently. She was told that she was not a candidate to have colon cancer screening/colonscopy right now due to her being on blood thinner. She reports that MD also concerned regarding her hernia and has recommended that she follow up with a surgeon to discuss possible surgical interventions. Patient continues to deny having any sxs related to hernia. She reports that her leg swelling has gone downand returned to normal. She is keeping leg elevated prn. She is ambulating and using cane. No falls reported. She denies any RN CM needs or concerns at this time.   [facility] CM Care Plan Problem One     Most Recent Value  Care Plan Problem One  Patient at risk for hospital readmission.  Role Documenting the Problem One  Care Management Telephonic [place] for Problem One  Active  [facility] Long Term Goal   Patient will have no hosptial readmissions within the next 31 days.  THN Long Term Goal Start Date  10/09/19  THN Long Term Goal Met Date  11/12/19  THN CM Short Term Goal #1   Patient will report increas/return to normal potassium level over the next 30 days.  THN CM Short Term Goal #1 Start Date  10/09/19  THN CM Short Term Goal #1 Met Date  11/12/19  THN CM Short Term Goal #2   Patient will complete post disharge MD follow up appt. over the next 30 days.  THN CM Short Term Goal #2 Start Date  10/09/19  THN CM Short Term Goal #2 Met Date  10/21/19  THN CM Short Term Goal #3  Patient will report med adherence to blood thinner regimen 100% over the next 30 days.  THN CM Short Term Goal #3 Start Date  10/09/19  THN CM Short Term Goal #3 Met Date  11/12/19    [facility] CM Care Plan Problem Two     Most Recent Value  Care  Plan Problem Two  Helath maintenance-patient due for mammogram.  Role Documenting the Problem Two  Care Management Telephonic Coordinator  Care Plan for Problem Two  Active  THN CM Short Term Goal #1   Patient will complete mammogram screening within the next 30 days.  THN CM Short Term Goal #1 Start Date  10/14/19  THN CM Short Term Goal #1 Met Date   11/12/19    [facility] CM Care Plan Problem Three     Most Recent Value  Care Plan Problem Three  Patient with hernia and needs further eval/workup  Role Documenting the Problem Three  Care Management Telephonic Coordinator  Care Plan for Problem Three  Active  THN Long Term Goal   Patient will report having surgical intervention for hernia over the next 90 days.  THN Long Term Goal Start Date  11/12/19  Interventions for Problem Three Long Term Goal  RN CM assessed for any sxs of hernia and proivded education.  THN CM Short Term Goal #1   Patient will report not experiencing any hernia related sxs over the next 30 days.  THN CM Short Term Goal #1 Start Date  11/12/19  Interventions for Short Term Goal #1  RN CM assessed for any sxs and reviewed s/s of worsening condition with pt.  Plan: RN CM discussed with patient next outreach within the month of January. Patient gave verbal consent and in agreement with RN CM follow up and  timeframe. Patient aware that they may contact RN CM sooner for any issues or concerns.   Enzo Montgomery, RN,BSN,CCM [place] Management Telephonic Care Management Coordinator Direct Phone: [phone] Toll Free: [phone] Fax: [phone]

## 2019-11-13 ENCOUNTER — Ambulatory Visit: Payer: Self-pay

## 2019-12-10 DIAGNOSIS — Z719 Counseling, unspecified: Secondary | ICD-10-CM | POA: Diagnosis not present

## 2019-12-10 DIAGNOSIS — K439 Ventral hernia without obstruction or gangrene: Secondary | ICD-10-CM | POA: Diagnosis not present

## 2019-12-10 DIAGNOSIS — I82409 Acute embolism and thrombosis of unspecified deep veins of unspecified lower extremity: Secondary | ICD-10-CM | POA: Diagnosis not present

## 2019-12-14 ENCOUNTER — Other Ambulatory Visit: Payer: Self-pay

## 2019-12-14 NOTE — Patient Outreach (Signed)
[facility]) Care Management  12/14/2019  Jenna Kelley [birth year] [id number]   Telephone Assessment   Outreach attempt #1 to patient. Spoke with patient who denies any acute issues or concerns at present. She shares that she had a "good but different holiday" due to virus outbreak with her family. She voices that PCP recently called and did a phone visit with her. She reports that she is to follow up again within about three months. Patient continues to voice that her leg in which DVT was in is doing well and returned back to normal. She is up ambulating and goes out. She uses cane as needed. No recent falls. Appetite remains good and no issues. She denies any RN CM needs or concerns at this time.    [facility] CM Care Plan Problem Three     Most Recent Value  Care Plan Problem Three  Patient with hernia and needs further eval/workup  Role Documenting the Problem Three  Care Management Telephonic Coordinator  Care Plan for Problem Three  Active  THN Long Term Goal   Patient will report having surgical intervention for hernia over the next 90 days.  THN Long Term Goal Start Date  11/12/19  Interventions for Problem Three Long Term Goal  RN CM assessed for any issues or concerns related to hernia. RN CM continued education to pt.   THN CM Short Term Goal #1   Patient will report not experiencing any hernia related sxs over the next 30 days.  THN CM Short Term Goal #1 Start Date  11/12/19  [facility] CM Short Term Goal #1 Met Date  12/14/19  Interventions for Short Term Goal #1  RNCM assessed for any sxs/issues or concerns. RN CM completed nutrition eval.       Plan: RN CM discussed with patient next outreach within a month. Patient gave verbal consent and in agreement with RN CM follow up and timeframe. Patient aware that they may contact RN CM sooner for any issues or concerns.  Enzo Montgomery, RN,BSN,CCM [place] Management Telephonic Care Management  Coordinator Direct Phone: [phone] Toll Free: [phone] Fax: [phone]

## 2019-12-15 NOTE — Telephone Encounter (Signed)
This encounter was created in error - please disregard.

## 2019-12-21 DIAGNOSIS — Z03818 Encounter for observation for suspected exposure to other biological agents ruled out: Secondary | ICD-10-CM | POA: Diagnosis not present

## 2019-12-21 DIAGNOSIS — Z20828 Contact with and (suspected) exposure to other viral communicable diseases: Secondary | ICD-10-CM | POA: Diagnosis not present

## 2020-01-12 ENCOUNTER — Other Ambulatory Visit: Payer: Self-pay

## 2020-01-12 NOTE — Patient Outreach (Signed)
[facility]) Care Management  01/12/2020  Jenna Kelley May 05, 1951 [id number]   Telephone Assessment    Outreach attempt #1 to patient. Spoke with patient who denies any issues or concerns at this time. She reports that she has been doing very well lately. She is happy to report that she is back to driving and doing a lot of things that she used to do. Appetite remains good. She denies any issues regarding her hernia. She continues to be unable to have any surgical intervention for hernia due to blood thinner usage. Patient reports that she [facility] She is unable to recall which type of vaccine she received. She states that she goes back in about a week to get second dose. She reports she got paperwork but does not have it handy. She denies any issues or concerns at present.    [facility] CM Care Plan Problem Three     Most Recent Value  Care Plan Problem Three  Patient with hernia and needs further eval/workup  Role Documenting the Problem Three  Care Management Telephonic Coordinator  Care Plan for Problem Three  Active  THN Long Term Goal   Patient will report having surgical intervention for hernia over the next 90 days.  THN Long Term Goal Start Date  11/12/19  Interventions for Problem Three Long Term Goal  RN CM assessed for any acute sxs/issues. RN CM assessed for next MD appt. RN CM reviewed with pt. s/s of worsening condition and when to seek medical attention.        Plan: RN CM discussed with patient next outreach within the month of March. Patient gave verbal consent and in agreement with RN CM follow up and timeframe. Patient aware that they may contact RN CM sooner for any issues or concerns. RN CM will send quarterly update to PCP.    Antionette Fairy, RN,BSN,CCM [facility] Care Management Telephonic Care Management Coordinator Direct Phone: [phone] Toll Free: [phone] Fax:  [phone]

## 2020-02-08 ENCOUNTER — Other Ambulatory Visit: Payer: Self-pay

## 2020-02-08 NOTE — Patient Outreach (Signed)
[facility]) Care Management  02/08/2020  Jenna Kelley 1951/02/15 [id number]   Telephone Assessment     Outreach attempt # 1 to patient. Spoke with patient irefly as she reported she was getting ready to runs oms errands. She voices that she is ding and feeling well. Patient remains of very strong faith and feels like she "healed." She voices that she is using the cane at times. No recent falls. Appetite WNL for patient. She voices that she has hd no issues with hernia and states she does not want surgery. Patient reports he has gotten both of her COVID-19 vaccine. She is unable to recall which dates and which type. Patient got vaccine at [facility]. [employer]. She reports she threw away paperwork. She denies having any SEs from vaccines. Patient states that she feels like she "passed blood clot" that was in her leg in her urine. She states a few weeks ago she went to the bathroom and he urine "was brown". She told her sister about it and her sister told her that it meantshe passed blood clot. She denies any other symptoms. Patient states she tried to tell MD but was not able to do so. RN CM attempted to discuss with patient probable causes and assess "brown urine." however, patient firm in her belief of what happened. She reports no further episodes and that urine is WNL. She reports she has virtual visit with PCP this week and will discuss. She denies any RN CM needs or concerns at this time.      Plan: RN CM discussed with patient next outreach within the month of April. Patient gave verbal consent and in agreement with RN CM follow up and timeframe. Patient aware that they may contact RN CM sooner for any issues or concerns.   Antionette Fairy, RN,BSN,CCM [facility] Care Management Telephonic Care Management Coordinator Direct Phone: [phone] Toll Free: [phone] Fax: [phone]

## 2020-02-09 DIAGNOSIS — K439 Ventral hernia without obstruction or gangrene: Secondary | ICD-10-CM | POA: Diagnosis not present

## 2020-02-09 DIAGNOSIS — T50905A Adverse effect of unspecified drugs, medicaments and biological substances, initial encounter: Secondary | ICD-10-CM | POA: Diagnosis not present

## 2020-02-09 DIAGNOSIS — I82409 Acute embolism and thrombosis of unspecified deep veins of unspecified lower extremity: Secondary | ICD-10-CM | POA: Diagnosis not present

## 2020-02-10 ENCOUNTER — Ambulatory Visit: Payer: Medicare HMO

## 2020-03-14 ENCOUNTER — Other Ambulatory Visit: Payer: Self-pay

## 2020-03-14 NOTE — Patient Outreach (Signed)
[facility]) Care Management  03/14/2020  Corinthian Kemler March 04, 1951 [id number]   Telephone Assessment     Outreach attempt to patient. Spoke briefly with patient as she reported she was "out eating breakfast" and requested call back at another time.     Plan: RN CM will make outreach attempt to patient within the month of May.   Antionette Fairy, RN,BSN,CCM [facility] Care Management Telephonic Care Management Coordinator Direct Phone: [phone] Toll Free: [phone] Fax: [phone]

## 2020-03-16 ENCOUNTER — Ambulatory Visit: Payer: Medicare HMO

## 2020-03-21 DIAGNOSIS — R609 Edema, unspecified: Secondary | ICD-10-CM | POA: Diagnosis not present

## 2020-03-21 DIAGNOSIS — D72818 Other decreased white blood cell count: Secondary | ICD-10-CM | POA: Diagnosis not present

## 2020-03-21 DIAGNOSIS — I82409 Acute embolism and thrombosis of unspecified deep veins of unspecified lower extremity: Secondary | ICD-10-CM | POA: Diagnosis not present

## 2020-03-21 DIAGNOSIS — D72819 Decreased white blood cell count, unspecified: Secondary | ICD-10-CM | POA: Diagnosis not present

## 2020-04-04 DIAGNOSIS — K439 Ventral hernia without obstruction or gangrene: Secondary | ICD-10-CM | POA: Diagnosis not present

## 2020-04-04 DIAGNOSIS — Z1211 Encounter for screening for malignant neoplasm of colon: Secondary | ICD-10-CM | POA: Diagnosis not present

## 2020-04-04 DIAGNOSIS — I82409 Acute embolism and thrombosis of unspecified deep veins of unspecified lower extremity: Secondary | ICD-10-CM | POA: Diagnosis not present

## 2020-04-18 ENCOUNTER — Other Ambulatory Visit: Payer: Self-pay

## 2020-04-18 NOTE — Patient Outreach (Signed)
[facility]) Care Management  04/18/2020  Jenna Kelley 02/19/1951 [id number]   Telephone Assessment   Outreach attempt to patient. Spoke briefly with patient as she reports she was out running errands with her sister. She voices that she has been feeling and doing well. She denies any RN CM needs or concerns at this and will call back at a later time.     Plan: RN CM will make outreach attempt to patient within the month of June.     Antionette Fairy, RN,BSN,CCM [facility] Care Management Telephonic Care Management Coordinator Direct Phone: [phone] Toll Free: [phone] Fax: [phone]

## 2020-04-20 ENCOUNTER — Ambulatory Visit: Payer: Self-pay

## 2020-04-26 DIAGNOSIS — Z1211 Encounter for screening for malignant neoplasm of colon: Secondary | ICD-10-CM | POA: Diagnosis not present

## 2020-05-16 ENCOUNTER — Other Ambulatory Visit: Payer: Self-pay

## 2020-05-16 NOTE — Patient Outreach (Signed)
[facility]) Care Management  05/16/2020  Jenna Kelley Oct 30, 1951 [id number]   Telephone Assessment    Outreach attempt to patient. Spoke with patient who denies any acute issues or concerns at present. She is pleased to report how well she has been feeling and doing. She has been able to get out and go do things that she normally would do now that she is vaccinated and things and places are starting to open back up. She is looking forward to her trip to [place] this weekend. RN CM discussed with patient that even though she is vaccinated that she still needs to adhere to safety guidelines and protocols. She voiced understanding. She reports no recent MD appts. Patient states that MD took her off blood thinner as her "issue had resolved." She reports only taking multi-vitamin. She remains adamant that she does not want any surgical intervention for hernia. She denies any pain or issues related to it. She remains independent with ADLs/IADLs. No recent falls. She voices no RN CM needs or concerns at this time.   Medications Reviewed Today    Reviewed by Hayden Pedro, RN (Registered Nurse) on 05/16/20 at [place] List Status: <None>  Medication Order Taking? Sig Documenting Provider Last Dose Status Informant  Apixaban Starter Pack (ELIQUIS STARTER PACK) 5 MG TBPK [id number] No Take as directed on package: start with two-49m tablets twice daily for 7 days. On day 8, switch to one-596mtablet twice daily.  Patient not taking: Reported on 05/16/2020   AlAline AugustMD Not Taking Active   Multiple Vitamin (MULTIVITAMIN WITH MINERALS) TABS tablet [id number] 1 tablet by mouth daily. [provider]  Active Self         [facility]     Most Recent Value  Care Plan Problem One patient at risk for bleeding due to blood thinner usage  Role Documenting the Problem One Care Management Telephonic [place]  Problem One Active  [facility]  Patient will report no blood clot or other issues over the next 90 days.  THN Long Term Goal Start Date 05/16/20  THN Long Term Goal Met Date 05/16/20  Interventions for Problem One Long Term Goal RNCM reviewed and discused with pt s/s of blood clots, action plan and ways to manage.    [facility]     Most Recent Value  Care Plan Problem Three Patient with hernia and needs further eval/workup  Role Documenting the Problem Three Care Management Telephonic Coordinator  Care Plan for Problem Three Active  THN Long Term Goal  Patient will report having surgical intervention for hernia over the next 90 days.  THN Long Term Goal Start Date 11/12/19  Interventions for Problem Three Long Term Goal RN CM discussed with patient and she declines any surgery at this time.     Plan: RN CM discussed with patient next outreach within the month of August . Patient gave verbal consent and in agreement with RN CM follow up and timeframe. Patient aware that they may contact RN CM sooner for any issues or concerns. RN CM will send quarterly update to PCP.   RoEnzo MontgomeryRN,BSN,CCM [facility] Beach[facility]agement Telephonic Care Management Coordinator Direct Phone: [phone] Free: [phone]: [phone]

## 2020-05-19 ENCOUNTER — Ambulatory Visit: Payer: Self-pay

## 2020-07-08 DIAGNOSIS — Z1331 Encounter for screening for depression: Secondary | ICD-10-CM | POA: Diagnosis not present

## 2020-07-08 DIAGNOSIS — Z1339 Encounter for screening examination for other mental health and behavioral disorders: Secondary | ICD-10-CM | POA: Diagnosis not present

## 2020-07-08 DIAGNOSIS — Z0000 Encounter for general adult medical examination without abnormal findings: Secondary | ICD-10-CM | POA: Diagnosis not present

## 2020-07-11 DIAGNOSIS — I872 Venous insufficiency (chronic) (peripheral): Secondary | ICD-10-CM | POA: Diagnosis not present

## 2020-07-12 ENCOUNTER — Other Ambulatory Visit: Payer: Self-pay | Admitting: Family Medicine

## 2020-07-12 DIAGNOSIS — Z1231 Encounter for screening mammogram for malignant neoplasm of breast: Secondary | ICD-10-CM

## 2020-07-20 ENCOUNTER — Other Ambulatory Visit: Payer: Self-pay

## 2020-07-20 NOTE — Patient Outreach (Signed)
[facility]) Care Management  07/20/2020  Lisabeth Mian 07-08-1951 [id number]   Telephone Assessment     Outreach attempt to patient. Spoke with patient who reported she was doing well. She reports that she is currently grocery shopping in store and unable to talk right now.      Plan: RN CM discussed with patient next outreach within the month of October. Patient gave verbal consent and in agreement with RN CM follow up and timeframe. Patient aware that they may contact RN CM sooner for any issues or concerns.  Antionette Fairy, RN,BSN,CCM [facility] Care Management Telephonic Care Management Coordinator Direct Phone: [phone] Toll Free: [phone] Fax: [phone]

## 2020-09-19 ENCOUNTER — Other Ambulatory Visit: Payer: Self-pay

## 2020-09-19 NOTE — Patient Outreach (Signed)
[facility]) Care Management  09/19/2020  Jenna Kelley Apr 27, 1951 [id number]   Telephone Assessment     Unsuccessful outreach attempt to patient.    Plan:  RN CM will make outreach attempt to patient next month if no return call from patient.   Antionette Fairy, RN,BSN,CCM [facility] Care Management Telephonic Care Management Coordinator Direct Phone: [phone] Toll Free: [phone] Fax: [phone]

## 2020-10-18 ENCOUNTER — Ambulatory Visit: Payer: Medicare HMO

## 2020-10-19 ENCOUNTER — Other Ambulatory Visit: Payer: Self-pay

## 2020-10-19 NOTE — Patient Outreach (Addendum)
[facility]) Care Management  10/19/2020  Jenna Kelley 1950/12/07 [id number]   Telephone Assessment Annual Assessment    Outreach attempt to patient. Spoke with patient who voices that she is doing well. She has been getting out more and doing things which has prevented her from being reachable by RN CM. Patient recently celebrated her 69th with family and friends and is looking forward to upcoming holidays. She shares that her and her sister have joined [employer] and are exercising/walking about 3x/wk. She continue to resides with her sister. She is independent with ADLs and IADLs except she does not drive. She denies any recent falls and not using assistive device. Appetite reported as being good and wgt stable. She denies any abd issues at present. She voices that she has upcoming mammogram within the next few weeks. She has completed annual wellness visit with MD. Patient reports that she recently got her flu and COVID-19 booster shot and tolerated them well. She did not have immunization card handy to update records. She denies any RN CM needs or concerns at this time.    Medications Reviewed Today    Reviewed by Charlyn Minerva, RN (Registered Nurse) on 10/19/20 at 1315  Med List Status: <None>  Medication Order Taking? Sig Documenting Provider Last Dose Status Informant  Apixaban Starter Pack (ELIQUIS STARTER PACK) 5 MG TBPK [id number] No Take as directed on package: start with two-5mg  tablets twice daily for 7 days. On day 8, switch to one-5mg  tablet twice daily.  Patient not taking: Reported on 05/16/2020   Glade Lloyd, MD Not Taking Active   Multiple Vitamin (MULTIVITAMIN WITH MINERALS) TABS tablet [id number] No Take 1 tablet by mouth daily. [provider] Taking Active Self         Depression screen [facility] 2/9 10/19/2020 10/09/2019  Decreased Interest 0 0  Down, Depressed, Hopeless 0 0  PHQ - 2 Score 0 0    Fall Risk   10/19/2020 05/16/2020 02/08/2020 12/14/2019 10/09/2019  Falls in the past year? 0 0 0 0 0  Number falls in past yr: 0 0 0 0 0  Injury with Fall? 0 0 0 0 0  Risk for fall due to : No Fall Risks Impaired balance/gait;Impaired mobility Impaired balance/gait;Impaired mobility Impaired balance/gait;Impaired mobility;Medication side effect Impaired balance/gait;Medication side effect  Follow up Falls evaluation completed;Education provided Falls evaluation completed;Education provided Falls evaluation completed Falls evaluation completed;Education provided Falls evaluation completed;Education provided   SDOH Screenings   Alcohol Screen:   . Last Alcohol Screening Score (AUDIT): Not on file  Depression (PHQ2-9): Low Risk   . PHQ-2 Score: 0  Financial Resource Strain:   . Difficulty of Paying Living Expenses: Not on file  Food Insecurity: No Food Insecurity  . Worried About Programme researcher, broadcasting/film/video in the Last Year: Never true  . Ran Out of Food in the Last Year: Never true  Housing: Low Risk   . Last Housing Risk Score: 0  Physical Activity:   . Days of Exercise per Week: Not on file  . Minutes of Exercise per Session: Not on file  Social Connections:   . Frequency of Communication with Friends and Family: Not on file  . Frequency of Social Gatherings with Friends and Family: Not on file  . Attends Religious Services: Not on file  . Active Member of Clubs or Organizations: Not on file  . Attends Banker Meetings: Not on file  . Marital Status: Not on file  Stress:   .  Feeling of Stress : Not on file  Tobacco Use: Low Risk   . Smoking Tobacco Use: Never Smoker  . Smokeless Tobacco Use: Never Used  Transportation Needs: No Transportation Needs  . Lack of Transportation (Medical): No  . Lack of Transportation (Non-Medical): No    Goals    . Make and Keep All Appointments     Follow Up Date 01/30/2021   - keep a calendar with appointment dates -complete all annual  wellness/preventive exams/screenings     Why is this important?   Part of staying healthy is seeing the doctor for follow-up care.  If you forget your appointments, there are some things you can do to stay on track.    Notes:  Patient has upcoming mammogram appt.    . Protect My Health     Follow Up Date 01/30/2021   - schedule appointment for vaccines needed due to my age or health - schedule recommended health tests (blood work, mammogram, colonoscopy, pap test) - schedule and keep appointment for annual check-up    Why is this important?   Screening tests can find diseases early when they are easier to treat.  Your doctor or nurse will talk with you about which tests are important for you.  Getting shots for common diseases like the flu and shingles will help prevent them.     Notes:         Plan: RN CM discussed with patient next outreach within the month of  Feb. Patient gave verbal consent and in agreement with RN CM follow up and timeframe. Patient aware that they may contact RN CM sooner for any issues or concerns. RN CM will send quarterly update to PCP.   Antionette Fairy, RN,BSN,CCM [facility] Care Management Telephonic Care Management Coordinator Direct Phone: [phone] Toll Free: [phone] Fax: [phone]

## 2020-10-24 ENCOUNTER — Ambulatory Visit: Payer: Self-pay

## 2020-11-24 ENCOUNTER — Ambulatory Visit
Admission: RE | Admit: 2020-11-24 | Discharge: 2020-11-24 | Disposition: A | Discharge status: Home / Self Care | Payer: Medicare HMO | Source: Ambulatory Visit | Attending: Family Medicine | Admitting: Family Medicine

## 2020-11-24 ENCOUNTER — Other Ambulatory Visit: Payer: Self-pay

## 2020-11-24 DIAGNOSIS — Z1231 Encounter for screening mammogram for malignant neoplasm of breast: Secondary | ICD-10-CM

## 2021-01-05 ENCOUNTER — Other Ambulatory Visit: Payer: Self-pay

## 2021-01-05 NOTE — Patient Outreach (Signed)
[facility]) Care Management  01/05/2021  Everlie Eble May 28, 1951 [id number]   Telephone Assessment   Outreach attempt to patient. Spoke briefly with patient who reported she was out running errands.   Plan: RN CM will make outreach attempt to patient within the month of April if no return call from patient.   Antionette Fairy, RN,BSN,CCM [facility] Care Management Telephonic Care Management Coordinator Direct Phone: [phone] Toll Free: [phone] Fax: [phone]

## 2021-01-06 ENCOUNTER — Ambulatory Visit: Payer: Self-pay

## 2021-03-14 ENCOUNTER — Other Ambulatory Visit: Payer: Self-pay

## 2021-03-14 NOTE — Patient Outreach (Signed)
[facility]) Care Management  03/14/2021  Nashaly Dorantes 1951-09-25 [id number]   Telephone Assessment    Successful outreach to patient. Spoke with patient who denies any acute issues or concerns at present. She is please to reports that since the weather is warning up she has been able to get out more and be more active. Patient along with her sister are going walking and exercising about 3x/wk. She continues to reside with her sister. Patient independent with ADLs/IADLs except she doe snot drive. No recent falls reported. Appetite good and wgt stable. She denies any RN CM needs or concerns at this time.    Medications Reviewed Today    Reviewed by Charlyn Minerva, RN (Registered Nurse) on 03/14/21 at 1054  Med List Status: <None>  Medication Order Taking? Sig Documenting Provider Last Dose Status Informant  Apixaban Starter Pack (ELIQUIS STARTER PACK) 5 MG TBPK [id number] No Take as directed on package: start with two-5mg  tablets twice daily for 7 days. On day 8, switch to one-5mg  tablet twice daily.  Patient not taking: Reported on 05/16/2020   Glade Lloyd, MD Not Taking Active   Multiple Vitamin (MULTIVITAMIN WITH MINERALS) TABS tablet [id number] No Take 1 tablet by mouth daily. [provider] Taking Active Self          Goals Addressed              This Visit's Progress   .  (THN)Make and Keep All Appointments (pt-stated)        Timeframe:  Long-Range Goal Priority:  High Start Date:  10/19/2020                           Expected End Date:  July 2022                    Follow Up Date July 2022   - ask family or friend for a ride - call to cancel if needed - keep a calendar with prescription refill dates - keep a calendar with appointment dates -complete all annual wellness/preventive exams/screenings     Why is this important?   Part of staying healthy is seeing the doctor for follow-up care.  If you forget your  appointments, there are some things you can do to stay on track.    Notes:  Patient has upcoming mammogram appt.  03/14/21- Patient due to make PCP and other annual  preventive appts(eye/dental).    .  (THN)Protect My Health (pt-stated)        Timeframe:  Long-Range Goal Priority:  High Start Date:  10/19/2020                           Expected End Date: July 2022                     Follow Up Date July 2022   - schedule appointment for vaccines needed due to my age or health - schedule recommended health tests (blood work, mammogram, colonoscopy, pap test) - schedule and keep appointment for annual check-up    Why is this important?   Screening tests can find diseases early when they are easier to treat.  Your doctor or nurse will talk with you about which tests are important for you.  Getting shots for common diseases like the flu and shingles will help prevent them.     Notes:  03/14/21-Patient reports that things are going well-no acute issues.She has been able to be more active and restart walking/exercising with her sister about 3x/day.        Plan:  RN CM discussed with patient next outreach within the month of July. Patient gave verbal consent and in agreement with RN CM follow up and timeframe. Patient aware that they may contact RN CM sooner for any issues or concerns. RN CM reviewed goals and plan of care with patient. Patient agrees to care plan and follow up. RN CM will send quarterly update to PCP.  Antionette Fairy, RN,BSN,CCM [facility] Care Management Telephonic Care Management Coordinator Direct Phone: [phone] Toll Free: [phone] Fax: [phone]

## 2021-03-15 ENCOUNTER — Ambulatory Visit: Payer: Self-pay

## 2021-06-15 ENCOUNTER — Other Ambulatory Visit: Payer: Self-pay

## 2021-06-15 NOTE — Patient Outreach (Signed)
[facility]) Care Management  06/15/2021  Jenna Kelley [birth year] [id number]   Telephone Assessment Quarterly Call   Unsuccessful outreach attempt to patient. No answer at present.    Plan: RN CM will make outreach attempt to patient within the month of Aug if no return call from patient.    Antionette Fairy, RN,BSN,CCM [facility] Care Management Telephonic Care Management Coordinator Direct Phone: [phone] Toll Free: [phone] Fax: [phone]

## 2021-07-20 DIAGNOSIS — D72819 Decreased white blood cell count, unspecified: Secondary | ICD-10-CM | POA: Diagnosis not present

## 2021-07-20 DIAGNOSIS — Z0000 Encounter for general adult medical examination without abnormal findings: Secondary | ICD-10-CM | POA: Diagnosis not present

## 2021-07-25 DIAGNOSIS — Z23 Encounter for immunization: Secondary | ICD-10-CM | POA: Diagnosis not present

## 2021-07-25 DIAGNOSIS — Z1331 Encounter for screening for depression: Secondary | ICD-10-CM | POA: Diagnosis not present

## 2021-07-25 DIAGNOSIS — Z1339 Encounter for screening examination for other mental health and behavioral disorders: Secondary | ICD-10-CM | POA: Diagnosis not present

## 2021-07-25 DIAGNOSIS — H6122 Impacted cerumen, left ear: Secondary | ICD-10-CM | POA: Diagnosis not present

## 2021-07-25 DIAGNOSIS — Z0000 Encounter for general adult medical examination without abnormal findings: Secondary | ICD-10-CM | POA: Diagnosis not present

## 2021-07-25 DIAGNOSIS — H6121 Impacted cerumen, right ear: Secondary | ICD-10-CM | POA: Diagnosis not present

## 2021-07-25 DIAGNOSIS — Z1211 Encounter for screening for malignant neoplasm of colon: Secondary | ICD-10-CM | POA: Diagnosis not present

## 2021-07-25 DIAGNOSIS — H6123 Impacted cerumen, bilateral: Secondary | ICD-10-CM | POA: Diagnosis not present

## 2021-07-27 ENCOUNTER — Other Ambulatory Visit: Payer: Self-pay | Admitting: Family Medicine

## 2021-07-27 DIAGNOSIS — Z1231 Encounter for screening mammogram for malignant neoplasm of breast: Secondary | ICD-10-CM

## 2021-07-27 DIAGNOSIS — E2839 Other primary ovarian failure: Secondary | ICD-10-CM

## 2021-07-28 ENCOUNTER — Other Ambulatory Visit: Payer: Self-pay

## 2021-07-28 NOTE — Patient Outreach (Signed)
[facility]) Care Management  07/28/2021  Jenna Kelley 04/05/1951 [id number]   Telephone Assessment Annual Assessment   Successful outreach call placed to patient. She denies any acute issues or concerns at present. She continues to reside in the home with her sister. Patient is independent with ADLs/IADLs except she no longer drives and her sister takes her everywhere she needs to go. Denies any recent falls. Patient ambulating without assistance. She has been doing some walking short distance for exercise. Appetite remains good and wgt stable. She denies any RN CM  needs or concerns at this time.      Medications Reviewed Today     Reviewed by Charlyn Minerva, RN (Registered Nurse) on 07/28/21 at [phone]  Med List Status: <None>   Medication Order Taking? Sig Documenting Provider Last Dose Status Informant  Apixaban Starter Pack (ELIQUIS STARTER PACK) 5 MG TBPK [id number] No Take as directed on package: start with two-5mg  tablets twice daily for 7 days. On day 8, switch to one-5mg  tablet twice daily.  Patient not taking: Reported on 05/16/2020   Glade Lloyd, MD Not Taking Active   Multiple Vitamin (MULTIVITAMIN WITH MINERALS) TABS tablet [id number] No Take 1 tablet by mouth daily. [provider] Taking Active Self             Depression screen [facility] 2/9 07/28/2021 10/19/2020 10/09/2019  Decreased Interest 0 0 0  Down, Depressed, Hopeless 0 0 0  PHQ - 2 Score 0 0 0    Fall Risk  07/28/2021 03/14/2021 10/19/2020 05/16/2020 02/08/2020  Falls in the past year? 0 0 0 0 0  Number falls in past yr: 0 0 0 0 0  Injury with Fall? 0 0 0 0 0  Risk for fall due to : No Fall Risks No Fall Risks No Fall Risks Impaired balance/gait;Impaired mobility Impaired balance/gait;Impaired mobility  Follow up Falls evaluation completed Falls evaluation completed;Education provided Falls evaluation completed;Education provided Falls evaluation completed;Education  provided Falls evaluation completed    SDOH Screenings   Alcohol Screen: Not on file  Depression (PHQ2-9): Low Risk    PHQ-2 Score: 0  Financial Resource Strain: Not on file  Food Insecurity: No Food Insecurity   Worried About Programme researcher, broadcasting/film/video in the Last Year: Never true   Ran Out of Food in the Last Year: Never true  Housing: Low Risk    Last Housing Risk Score: 0  Physical Activity: Not on file  Social Connections: Not on file  Stress: Not on file  Tobacco Use: Low Risk    Smoking Tobacco Use: Never   Smokeless Tobacco Use: Never  Transportation Needs: No Transportation Needs   Lack of Transportation (Medical): No   Lack of Transportation (Non-Medical): No     Goals Addressed               This Visit's Progress     ([facility])Make and Keep All Appointments (pt-stated)        Timeframe:  Long-Range Goal Priority:  High Start Date:  10/19/2020                           Expected End Date:  Dec2022                    Follow Up Date Dec2022  Barriers: Health Behaviors Knowledge    - ask family or friend for a ride - call to cancel if needed - keep  a calendar with prescription refill dates - keep a calendar with appointment dates -complete all annual wellness/preventive exams/screenings     Why is this important?   Part of staying healthy is seeing the doctor for follow-up care.  If you forget your appointments, there are some things you can do to stay on track.    Notes:  Patient has upcoming mammogram appt.  03/14/21- Patient due to make PCP and other annual  preventive appts(eye/dental).  07/28/21-Patient competed AWV but till needs to make other preventive/annual appts.       ([facility])Protect My Health (pt-stated)        Timeframe:  Long-Range Goal Priority:  High Start Date:  10/19/2020                           Expected End Date: Dec 2022                     Follow Up Date Nov 2022  Barriers: Health Behaviors Knowledge    - schedule appointment for  vaccines needed due to my age or health - schedule recommended health tests (blood work, mammogram, colonoscopy, pap test) - schedule and keep appointment for annual check-up    Why is this important?   Screening tests can find diseases early when they are easier to treat.  Your doctor or nurse will talk with you about which tests are important for you.  Getting shots for common diseases like the flu and shingles will help prevent them.     Notes: 03/14/21-Patient reports that things are going well-no acute issues.She has been able to be more active and restart walking/exercising with her sister about 3x/day.    07/28/21-Patient states she completed AWV earlier this week. She needs to make mammogram appt. She has not had annual eye exam since last year.         Plan: RN CM discussed with patient next outreach within the month of Nov. Patient agrees to care plan and follow up. Patient gave verbal consent and in agreement with RN CM follow up and timeframe. Patient aware that they may contact RN CM sooner for any issues or concerns. RN CM reviewed goals and plan of care with patient. RN CM will send quarterly update to PCP.  Antionette Fairy, RN,BSN,CCM [facility] Care Management Telephonic Care Management Coordinator Direct Phone: [phone] Toll Free: [phone] Fax: [phone]

## 2021-10-16 ENCOUNTER — Other Ambulatory Visit: Payer: Self-pay

## 2021-10-16 NOTE — Patient Outreach (Signed)
[facility]) Care Management  10/16/2021  Jenna Kelley 09/22/1951 [id number]   Telephone Assessment   Successful outreach call paled to patient. She denies any acute issues or concerns at present. She saw PCP last month for routine visit. She continues to reside in the home along with her sister. She is looking forward to celebrating her birthday on tomorrow. She denies any RN CM needs or concerns at this time.     Medications Reviewed Today     Reviewed by Charlyn Minerva, RN (Registered Nurse) on 10/16/21 at 1303  Med List Status: <None>   Medication Order Taking? Sig Documenting Provider Last Dose Status Informant  Apixaban Starter Pack (ELIQUIS STARTER PACK) 5 MG TBPK [id number] No Take as directed on package: start with two-5mg  tablets twice daily for 7 days. On day 8, switch to one-5mg  tablet twice daily.  Patient not taking: Reported on 05/16/2020   Glade Lloyd, MD Not Taking Active   Multiple Vitamin (MULTIVITAMIN WITH MINERALS) TABS tablet [id number] No Take 1 tablet by mouth daily. [provider] Taking Active Self             Care Plan : General Plan of Care (Adult)  Updates made by Charlyn Minerva, RN since 10/16/2021 12:00 AM     Problem: Quality of Life (General Plan of Care)      Long-Range Goal: Quality of Life Maintained Completed 10/16/2021  Start Date: 10/19/2020  Expected End Date: 12/01/2021  Recent Progress: On track  Priority: High  Note:   Evidence-based guidance:  Assess patient's thoughts about quality of life, goals and expectations, and dissatisfaction or desire to improve.  Identify issues of primary importance such as mental health, illness, exercise tolerance, pain, sexual function and intimacy, cognitive change, social isolation, finances and relationships.  Assess and monitor for signs/symptoms of psychosocial concerns, especially depression or ideations regarding harm to others  or self; provide or refer for mental health services as needed.  Identify sensory issues that impact quality of life such as hearing loss, vision deficit; strategize ways to maintain or improve hearing, vision.  Promote access to services in the community to support independence such as support groups, home visiting programs, financial assistance, handicapped parking tags, durable medical equipment and emergency responder.  Promote activities to decrease social isolation such as group support or social, leisure and recreational activities, employment, use of social media; consider safety concerns about being out of home for activities.  Provide patient an opportunity to share by storytelling or a "life review" to give positive meaning to life and to assist with coping and negative experiences.  Encourage patient to tap into hope to improve sense of self.  Counsel based on prognosis and as early as possible about end-of-life and palliative care; consider referral to palliative care provider.  Advocate for the development of palliative care plan that may include avoidance of unnecessary testing and intervention, symptom control, discontinuation of medications, hospice and organ donation.  Counsel as early as possible those with life-limiting chronic disease about palliative care; consider referral to palliative care provider.  Advocate for the development of palliative care plan.   Notes:   03/14/2021 RN CM assessed for any acute issues or concerns. RN CM reviewed action plan if sxs present. RN CM confirmed MD f/u appts in place.  07/28/21 RN CM assessed for any acute issues/sxs. RN CM confirmed pt has completed MD appt. RN CM educated pt on importance of completing preventive/annual exams.   10/16/21-Resolving due  to duplicate care plan and goals    Task: Support and Maintain Acceptable Degree of Health, Comfort and Happiness Completed 10/16/2021  Note:   Care Management Activities:    -  affirmation provided - independence in all possible areas promoted - life review by storytelling encouraged - patient strengths promoted - self-expression encouraged - wellness behaviors promoted    Notes:    Care Plan : RN Care Manager POC  Updates made by Charlyn Minerva, RN since 10/16/2021 12:00 AM     Problem: Chronic Disease Mgmt of Chronic Conditions   Priority: High     Long-Range Goal: Development of POC for Mgmt of Chronic Conditions(hx of DVT, hernia)   Start Date: 10/16/2021  Expected End Date: 12/16/2021  Priority: High  Note:    Current Barriers:  Chronic Disease Management support and education needs 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   Interventions: POC sent to PCP upon initial assessment, quarterly and with any changes in patient's conditions Inter-disciplinary care team collaboration (see longitudinal plan of care) Evaluation of current treatment plan related to  self management and patient's adherence to plan as established by provider     Other-DVT & herniaNew goal. Evaluation of current treatment plan related to  hx of DVT and hernia ,  ongoing disease mgm education & support of chronic conditions,  self-management and patient's adherence to plan as established by provider. Discussed plans with patient for ongoing care management follow up and provided patient with direct contact information for care management team Evaluation of current treatment plan related to chronic conditions and patient's adherence to plan as established by provider; Provided education to patient re: wellness/preventive measures-pt plans to make appt beginnign of new year for mamogram and bone density exam.  She reprots she got COVID bosoter and flu vaccine a few wks ago;  Patient Goals/Self-Care Activities: Patient will self administer medications as prescribed Patient will attend all scheduled provider appointments Patient will schedule & complete preventive exams  Follow Up Plan:  Telephone follow up appointment with care management team member scheduled for:  quarterly-within the month of Feb. The patient has been provided with contact information for the care management team and has been advised to call with any health related questions or concerns.       Plan: RN CM discussed with patient next outreach within the month of Feb. Patient agrees to care plan and follow up.   Antionette Fairy, RN,BSN,CCM [facility] Care Management Telephonic Care Management Coordinator Direct Phone: [phone] Toll Free: [phone] Fax: [phone]

## 2021-10-17 ENCOUNTER — Ambulatory Visit: Payer: Self-pay

## 2022-01-08 ENCOUNTER — Other Ambulatory Visit: Payer: Self-pay

## 2022-01-08 NOTE — Patient Outreach (Addendum)
[facility]) Care Management  01/08/2022  Jenna Kelley 08-21-1951 [id number]   Telephone Assessment    Successful outreach call placed to patient. Patient reports she is doing fairly well. She is using cane to aide in ambulation. She remains independent with ADLs/IADLS except she no longer drives. She lives with her sister who drives her to appts. Appetite remains good. Wgt stable. She does not see PCP until Aug. She has annual mammogram on 01/19/22. Denies any RN CM needs or concerns.   Medications Reviewed Today     Reviewed by Hayden Pedro, RN (Registered Nurse) on 01/08/22 at [phone]  Med List Status: <None>   Medication Order Taking? Sig Documenting Provider Last Dose Status Informant  Apixaban Starter Pack (ELIQUIS STARTER PACK) 5 MG TBPK [id number] No Take as directed on package: start with two-5mg  tablets twice daily for 7 days. On day 8, switch to one-5mg  tablet twice daily.  Patient not taking: Reported on 05/16/2020   Aline August, MD Not Taking Active   Multiple Vitamin (MULTIVITAMIN WITH MINERALS) TABS tablet [id number] No Take 1 tablet by mouth daily. 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   Interventions: POC sent to PCP upon initial assessment, quarterly and with any changes in patient's conditions Inter-disciplinary care team collaboration (see longitudinal plan of care) Evaluation of current treatment plan related to  self management and patient's adherence to plan as established by provider     Other-DVT & herniaGoal on track:  Yes. Evaluation of current treatment plan related to  hx of DVT and hernia ,  ongoing disease mgm education & support of chronic conditions,  self-management and patient's adherence to plan as established by provider. Discussed plans with patient for ongoing care management follow up and provided patient with direct contact information for care management team Evaluation of current treatment plan related to chronic conditions and patient's adherence to plan as established by provider; Provided education to patient re: wellness/preventive measures-pt plans to make appt beginnign of new year for mamogram and bone density exam. She reprots she got COVID bosoter and flu vaccine a few wks ago; 01/08/22-Patient continues to do very well-not having any issues at present and doing well.  Falls Interventions:  (Status:  New goal.) Long Term Goal Assessed for falls since last encounter Assessed patients knowledge of fall risk prevention secondary to previously provided education   Patient Goals/Self-Care Activities: Patient will self administer medications as prescribed Patient will attend all scheduled provider appointments Patient will schedule & complete preventive exams  Follow Up Plan:  Telephone follow up appointment with care management team member scheduled for:  quarterly-within the month of May. The patient has been provided  with contact information for the care management team and has been advised to call with any health related questions or concerns.         Plan: RN CM discussed with patient next outreach  within the month of May.Patient agrees to care plan and follow up. RN CM will send quarterly update to PCP.   Enzo Montgomery, RN,BSN,CCM [place] Management Telephonic Care Management Coordinator Direct Phone: [phone] Toll Free: [phone] Fax: [phone]

## 2022-01-18 ENCOUNTER — Ambulatory Visit
Admission: RE | Admit: 2022-01-18 | Discharge: 2022-01-18 | Disposition: A | Discharge status: Home / Self Care | Payer: Medicare HMO | Source: Ambulatory Visit | Attending: Family Medicine | Admitting: Family Medicine

## 2022-01-18 ENCOUNTER — Ambulatory Visit: Payer: Medicare HMO

## 2022-01-18 ENCOUNTER — Other Ambulatory Visit: Payer: Self-pay

## 2022-01-18 DIAGNOSIS — M8589 Other specified disorders of bone density and structure, multiple sites: Secondary | ICD-10-CM | POA: Diagnosis not present

## 2022-01-18 DIAGNOSIS — Z780 Asymptomatic menopausal state: Secondary | ICD-10-CM | POA: Diagnosis not present

## 2022-01-18 DIAGNOSIS — E2839 Other primary ovarian failure: Secondary | ICD-10-CM

## 2022-01-18 DIAGNOSIS — Z1231 Encounter for screening mammogram for malignant neoplasm of breast: Secondary | ICD-10-CM

## 2022-01-19 ENCOUNTER — Other Ambulatory Visit: Payer: Self-pay

## 2022-01-19 NOTE — Patient Outreach (Signed)
[facility]) Care Management  01/19/2022  Jenna Kelley Nov 15, 1951 [id number]      Voicemail message received from patient. Return call placed. She stats she wanted some general info on PCS and RN CM provided answers to her questions. She was thinking about this in case there came a time when her sister was not able to assist her. Also, patient reports she went to get mammogram and bone density done yesterday.       Plan: RN CM will outreach patient as previously scheduled.     Antionette Fairy, RN,BSN,CCM [facility] Care Management Telephonic Care Management Coordinator Direct Phone: [phone] Toll Free: [phone] Fax: [phone]

## 2022-01-29 DIAGNOSIS — E559 Vitamin D deficiency, unspecified: Secondary | ICD-10-CM | POA: Diagnosis not present

## 2022-01-30 DIAGNOSIS — M810 Age-related osteoporosis without current pathological fracture: Secondary | ICD-10-CM | POA: Diagnosis not present

## 2022-01-30 DIAGNOSIS — M8580 Other specified disorders of bone density and structure, unspecified site: Secondary | ICD-10-CM | POA: Diagnosis not present

## 2022-04-03 ENCOUNTER — Other Ambulatory Visit: Payer: Self-pay

## 2022-04-03 NOTE — Patient Outreach (Signed)
[facility]) Care Management ? ?04/03/2022 ? ?Jenna Kelley ?03-Jun-1951 ?[id number] ? ? ?Telephone Assessment ? ? ?Successful outreach call placed to patient. Spoke briefly as patient getting ready to go into nail salon. She continues to be very active and gets out almost daily with her sister to run errands and do things. She remains independent with ADLS/IADLs except she relies on her sister to drive and take her places. Appetite good. Wgt stable. Denies any recent falls. No RN CM needs or concerns at this time.  ? ?Medications Reviewed Today   ? ? Reviewed by Charlyn Minerva, RN (Registered Nurse) on 04/03/22 at [phone]  Med List Status: <None>  ? ?Medication Order Taking? Sig Documenting Provider Last Dose Status Informant  ?Apixaban Starter Pack (ELIQUIS STARTER PACK) 5 MG TBPK [id number]  Take as directed on package: start with two-5mg  tablets twice daily for 7 days. On day 8, switch to one-5mg  tablet twice daily.  ?Patient not taking: Reported on 05/16/2020  ? Glade Lloyd, MD  Active   ?cholecalciferol (VITAMIN D3) 25 MCG (1000 UNIT) tablet [id number] Yes Take 1,000 Units by mouth daily. [provider]  Active Self  ?Multiple Vitamin (MULTIVITAMIN WITH MINERALS) TABS tablet [id number]  Take 1 tablet by mouth daily. [provider]  Active Self  ? ?  ?  ? ?  ?  ?Care Plan : RN Care Manager POC  ?Updates made by Charlyn Minerva, RN since 04/03/2022 12:00 AM  ?  ? ?Problem: Chronic Disease Mgmt of Chronic Conditions   ?Priority: High  ?  ? ?Long-Range Goal: Development of POC for Mgmt of Chronic Conditions(hx of DVT, hernia)   ?Start Date: 10/16/2021  ?Expected End Date: 12/16/2021  ?This Visit's Progress: On track  ?Recent Progress: On track  ?Priority: High  ?Note:   ? 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 ?RNCM Clinical Goal(s):  ?Patient will verbalize understanding of plan  for management of chronic conditions ?attend all scheduled medical appointments: ongoing disease mgmt education and support  through collaboration with RN Care manager, provider, and care team.  ? ?Interventions: ?POC sent to PCP upon initial assessment, quarterly and with any changes in patient's conditions ?Inter-disciplinary care team collaboration (see longitudinal plan of care) ?Evaluation of current treatment plan related to  self management and patient's adherence to plan as established by provider ? ? ? ? ?Other-DVT & herniaGoal on track:  Yes. ?Evaluation of current treatment plan related to  hx of DVT and hernia ,  ongoing disease mgm education & support of chronic conditions,  self-management and patient's adherence to plan as established by provider. ?Discussed plans with patient for ongoing care management follow up and provided patient with direct contact information for care management team ?Evaluation of current treatment plan related to chronic conditions and patient's adherence to plan as established by provider; ?Provided education to patient re: wellness/preventive measures-pt plans to make appt beginnign of new year for mamogram and bone density exam. She reprots she got COVID bosoter and flu vaccine a few wks ago; ?01/08/22-Patient continues to do very well-not having any issues at present and doing well. ? ?Falls Interventions:  (Status:  Goal on track:  Yes.) Long Term Goal ?Assessed for falls since last encounter ?Assessed patients knowledge of fall risk prevention secondary to previously provided education  ?04/03/22-Patient ambulating with use of cane. Denies any recent falls.  ? ?Patient Goals/Self-Care Activities: ?Patient will self  administer medications as prescribed ?Patient will attend all scheduled provider appointments ?Patient will schedule & complete preventive exams ? ?Follow Up Plan:  Telephone follow up appointment with care management team member scheduled for:  quarterly-within  the month of Aug. ?The patient has been provided with contact information for the care management team and has been advised to call with any health related questions or concerns.   ?  ?  ?Plan: ?RN CM discussed with patient next outreach within the month of Aug. Patient agrees to care plan and follow up. ?RN CM will send quarterly update to PCP.  ? ?Antionette Fairy, RN,BSN,CCM ?[facility] Care Management ?Telephonic Care Management Coordinator ?Direct Phone: [phone] ?Toll Free: [phone] ?Fax: [phone] ? ?

## 2022-05-17 ENCOUNTER — Other Ambulatory Visit: Payer: Self-pay

## 2022-05-17 NOTE — Patient Outreach (Signed)
[facility]) Care Management  05/17/2022  Jenna Kelley 04/03/1951 [id number]   Case Closure  Patient has remained stable. No recent admissions. Goals have been met. Successful outreach all to patient and explained case closure and rationale. She voices understanding and appreciation for RN CM support through the years.     Plan: RN CM will close case.   Roshanda Florance, RN,BSN,CCM [facility] Care Management Telephonic Care Management Coordinator Direct Phone: [phone] Toll Free: [phone] Fax: [phone]  

## 2022-07-05 ENCOUNTER — Ambulatory Visit: Payer: Self-pay

## 2022-07-26 DIAGNOSIS — Z0000 Encounter for general adult medical examination without abnormal findings: Secondary | ICD-10-CM | POA: Diagnosis not present

## 2022-07-26 DIAGNOSIS — E782 Mixed hyperlipidemia: Secondary | ICD-10-CM | POA: Diagnosis not present

## 2022-07-26 DIAGNOSIS — Z114 Encounter for screening for human immunodeficiency virus [HIV]: Secondary | ICD-10-CM | POA: Diagnosis not present

## 2022-07-26 DIAGNOSIS — Z810 Family history of intellectual disabilities: Secondary | ICD-10-CM | POA: Diagnosis not present

## 2022-07-26 DIAGNOSIS — D72819 Decreased white blood cell count, unspecified: Secondary | ICD-10-CM | POA: Diagnosis not present

## 2022-07-31 DIAGNOSIS — Z1331 Encounter for screening for depression: Secondary | ICD-10-CM | POA: Diagnosis not present

## 2022-07-31 DIAGNOSIS — H6121 Impacted cerumen, right ear: Secondary | ICD-10-CM | POA: Diagnosis not present

## 2022-07-31 DIAGNOSIS — R5381 Other malaise: Secondary | ICD-10-CM | POA: Diagnosis not present

## 2022-07-31 DIAGNOSIS — Z0000 Encounter for general adult medical examination without abnormal findings: Secondary | ICD-10-CM | POA: Diagnosis not present

## 2022-07-31 DIAGNOSIS — E785 Hyperlipidemia, unspecified: Secondary | ICD-10-CM | POA: Diagnosis not present

## 2022-07-31 DIAGNOSIS — Z1211 Encounter for screening for malignant neoplasm of colon: Secondary | ICD-10-CM | POA: Diagnosis not present

## 2022-07-31 DIAGNOSIS — Z23 Encounter for immunization: Secondary | ICD-10-CM | POA: Diagnosis not present

## 2022-07-31 DIAGNOSIS — Z1339 Encounter for screening examination for other mental health and behavioral disorders: Secondary | ICD-10-CM | POA: Diagnosis not present

## 2022-08-15 DIAGNOSIS — M170 Bilateral primary osteoarthritis of knee: Secondary | ICD-10-CM | POA: Diagnosis not present

## 2022-08-20 DIAGNOSIS — M159 Polyosteoarthritis, unspecified: Secondary | ICD-10-CM | POA: Diagnosis not present

## 2022-09-14 ENCOUNTER — Encounter (HOSPITAL_COMMUNITY): Payer: Self-pay

## 2022-09-14 ENCOUNTER — Emergency Department (HOSPITAL_COMMUNITY): Payer: Medicare HMO

## 2022-09-14 ENCOUNTER — Other Ambulatory Visit: Payer: Self-pay

## 2022-09-14 ENCOUNTER — Emergency Department (HOSPITAL_COMMUNITY)
Admission: EM | Admit: 2022-09-14 | Discharge: 2022-09-15 | Discharge status: Against Medical Advice | Payer: Medicare HMO | Attending: Student | Admitting: Student

## 2022-09-14 DIAGNOSIS — R2241 Localized swelling, mass and lump, right lower limb: Secondary | ICD-10-CM | POA: Insufficient documentation

## 2022-09-14 DIAGNOSIS — Z5321 Procedure and treatment not carried out due to patient leaving prior to being seen by health care provider: Secondary | ICD-10-CM | POA: Diagnosis not present

## 2022-09-14 DIAGNOSIS — R001 Bradycardia, unspecified: Secondary | ICD-10-CM | POA: Diagnosis not present

## 2022-09-14 DIAGNOSIS — Z86718 Personal history of other venous thrombosis and embolism: Secondary | ICD-10-CM | POA: Diagnosis not present

## 2022-09-14 DIAGNOSIS — I517 Cardiomegaly: Secondary | ICD-10-CM | POA: Insufficient documentation

## 2022-09-14 DIAGNOSIS — R443 Hallucinations, unspecified: Secondary | ICD-10-CM | POA: Insufficient documentation

## 2022-09-14 DIAGNOSIS — R0602 Shortness of breath: Secondary | ICD-10-CM | POA: Diagnosis not present

## 2022-09-14 LAB — URINALYSIS, ROUTINE W REFLEX MICROSCOPIC
Bilirubin Urine: NEGATIVE
Glucose, UA: NEGATIVE mg/dL
Ketones, ur: NEGATIVE mg/dL
Nitrite: NEGATIVE
Protein, ur: NEGATIVE mg/dL
Specific Gravity, Urine: 1.019 (ref 1.005–1.030)
pH: 5 (ref 5.0–8.0)

## 2022-09-14 LAB — CBC WITH DIFFERENTIAL/PLATELET
Abs Immature Granulocytes: 0.01 10*3/uL (ref 0.00–0.07)
Basophils Absolute: 0 10*3/uL (ref 0.0–0.1)
Basophils Relative: 0 %
Eosinophils Absolute: 0.1 10*3/uL (ref 0.0–0.5)
Eosinophils Relative: 3 %
HCT: 40.4 % (ref 36.0–46.0)
Hemoglobin: 12.6 g/dL (ref 12.0–15.0)
Immature Granulocytes: 0 %
Lymphocytes Relative: 34 %
Lymphs Abs: 1.3 10*3/uL (ref 0.7–4.0)
MCH: 28.7 pg (ref 26.0–34.0)
MCHC: 31.2 g/dL (ref 30.0–36.0)
MCV: 92 fL (ref 80.0–100.0)
Monocytes Absolute: 0.4 10*3/uL (ref 0.1–1.0)
Monocytes Relative: 10 %
Neutro Abs: 2.1 10*3/uL (ref 1.7–7.7)
Neutrophils Relative %: 53 %
Platelets: 189 10*3/uL (ref 150–400)
RBC: 4.39 MIL/uL (ref 3.87–5.11)
RDW: 13.8 % (ref 11.5–15.5)
WBC: 3.9 10*3/uL — ABNORMAL LOW (ref 4.0–10.5)
nRBC: 0 % (ref 0.0–0.2)

## 2022-09-14 LAB — BASIC METABOLIC PANEL
Anion gap: 8 (ref 5–15)
BUN: 16 mg/dL (ref 8–23)
CO2: 26 mmol/L (ref 22–32)
Calcium: 9.5 mg/dL (ref 8.9–10.3)
Chloride: 107 mmol/L (ref 98–111)
Creatinine, Ser: 0.74 mg/dL (ref 0.44–1.00)
GFR, Estimated: >60 mL/min (ref >60)
Glucose, Bld: 104 mg/dL — ABNORMAL HIGH (ref 70–99)
Potassium: 3.8 mmol/L (ref 3.5–5.1)
Sodium: 141 mmol/L (ref 135–145)

## 2022-09-14 LAB — BRAIN NATRIURETIC PEPTIDE: B Natriuretic Peptide: 84.2 pg/mL (ref 0.0–100.0)

## 2022-09-14 NOTE — ED Triage Notes (Addendum)
Pt arrives with her sister with c/o hearing "demonic spirits" that has been ongoing for months. She denies SI/HI. She also presents with swelling to her right foot that she noticed when she woke up today. Hx of blood clot in left leg in 2019, no longer taking Eliquis. Denies CP/SOB.

## 2022-09-14 NOTE — ED Provider Triage Note (Signed)
Emergency Medicine Provider Triage Evaluation Note  Jenna Kelley , a 71 y.o. female  was evaluated in triage.  Pt complains of right leg swelling going on for last couple days no associated shortness of breath no calf tenderness, has history of DVTs no history of PEs she is currently not on anticoag, she denies any suicidal homicidal ideations but she states that she is sees the devil around,  Family is at bedside states that she has been hallucinating over the last couple days   Review of Systems  Positive: Leg swelling, hallucinations Negative: Homicidal/suicidal ideations  Physical Exam  BP (!) 115/53 (BP Location: Right Arm)   Pulse 63   Temp 98.1 F (36.7 C) (Oral)   Resp 14   Ht 5\' 5"  (1.651 m)   Wt 71.7 kg   SpO2 96%   BMI 26.29 kg/m  Gen:   Awake, no distress   Resp:  Normal effort  MSK:   Moves extremities without difficulty  Other:    Medical Decision Making  Medically screening exam initiated at 6:34 PM.  Appropriate orders placed.  Jenna Kelley was informed that the remainder of the evaluation will be completed by another provider, this initial triage assessment does not replace that evaluation, and the importance of remaining in the ED until their evaluation is complete.  Lab work imaging been ordered will need further work-up.   Marcello Fennel, PA-C 09/14/22 1837

## 2022-09-14 NOTE — Progress Notes (Signed)
Called out for patient 3x in waiting room - no answer.  Deno Etienne, PA-C made aware.   09/14/2022 7:48 PM Abeer Iversen RVT, RDMS

## 2022-09-15 NOTE — ED Notes (Signed)
Pt called for vitals x2 with no asnwer.

## 2022-09-15 NOTE — ED Notes (Signed)
Pt name called for second blood draw, no response

## 2022-09-18 DIAGNOSIS — N3000 Acute cystitis without hematuria: Secondary | ICD-10-CM | POA: Diagnosis not present

## 2022-09-18 DIAGNOSIS — H6123 Impacted cerumen, bilateral: Secondary | ICD-10-CM | POA: Diagnosis not present

## 2022-09-18 DIAGNOSIS — F411 Generalized anxiety disorder: Secondary | ICD-10-CM | POA: Diagnosis not present

## 2022-09-18 DIAGNOSIS — H6122 Impacted cerumen, left ear: Secondary | ICD-10-CM | POA: Diagnosis not present

## 2022-09-18 DIAGNOSIS — Z23 Encounter for immunization: Secondary | ICD-10-CM | POA: Diagnosis not present

## 2022-09-18 DIAGNOSIS — G4700 Insomnia, unspecified: Secondary | ICD-10-CM | POA: Diagnosis not present

## 2022-09-18 DIAGNOSIS — H9190 Unspecified hearing loss, unspecified ear: Secondary | ICD-10-CM | POA: Diagnosis not present

## 2022-09-18 DIAGNOSIS — H6121 Impacted cerumen, right ear: Secondary | ICD-10-CM | POA: Diagnosis not present

## 2022-09-18 DIAGNOSIS — F331 Major depressive disorder, recurrent, moderate: Secondary | ICD-10-CM | POA: Diagnosis not present

## 2022-09-19 NOTE — Therapy (Signed)
OUTPATIENT PHYSICAL THERAPY LOWER EXTREMITY EVALUATION   Patient Name: Jenna Kelley MRN: [id number] DOB:04-11-1951, 71 y.o., female Today's Date: 09/21/2022   PT End of Session - 09/21/22 0543     Visit Number 1    Number of Visits 13    Date for PT Re-Evaluation 11/09/22    Authorization Type [employer]IC[employer] HMO    PT Start Time 0935    PT Stop Time 1020    PT Time Calculation (min) 45 min    Equipment Utilized During Treatment Other (comment)   rollator   Activity Tolerance Patient tolerated treatment well    Behavior During Therapy WFL for tasks assessed/performed             Past Medical History:  Diagnosis Date   Cataract    DVT (deep venous thrombosis) ([place])    left leg   Osteoarthritis    shoulder   Umbilical hernia    Past Surgical History:  Procedure Laterality Date   NO PAST SURGERIES     Patient Active Problem List   Diagnosis Date Noted   Acute deep vein thrombosis (DVT) of calf muscle vein of left lower extremity ([place]) 10/06/2019   DVT (deep venous thrombosis) ([place]) 10/05/2019   Hypokalemia 10/05/2019    PCP: Jenna Bien, MD  REFERRING PROVIDER: Fanny Bien, MD   REFERRING DIAG: physical deconditioning  THERAPY DIAG:  Difficulty in walking, not elsewhere classified - Plan: PT plan of care cert/re-cert  Muscle weakness (generalized) - Plan: PT plan of care cert/re-cert  Stiffness of left hip, not elsewhere classified - Plan: PT plan of care cert/re-cert  Stiffness of right hip, not elsewhere classified - Plan: PT plan of care cert/re-cert  Stiffness of left knee, not elsewhere classified - Plan: PT plan of care cert/re-cert  Stiffness of right knee, not elsewhere classified - Plan: PT plan of care cert/re-cert  Rationale for Evaluation and Treatment Rehabilitation  ONSET DATE: Chronic  SUBJECTIVE:   SUBJECTIVE STATEMENT: Pt reports she has difficulty with walking and has been using a rollator for approx 1 to  1.5 years. She use a rollator outside her home and a cane in her home. Pt receives BSA to [facility] outside the home and is mod Ind in the home. Pt notes she has been knocked knee her whole life, but it has become worse over the past year. Pt denies hip or knee pain. Pt reports having significant difficulty with lifting her legs to go up steps. Pt lives with her sister, Jenna Kelley, who was present for the eval.   PERTINENT HISTORY: OA, osteoporosis, cataracts  PAIN:  Are you having pain? No  PRECAUTIONS: Fall  WEIGHT BEARING RESTRICTIONS No  FALLS:  Has patient fallen in last 6 months? No  LIVING ENVIRONMENT: Lives with: lives with their family Lives in: House/apartment Stairs: Yes: Internal: 2 steps; can reach both Has following equipment at home: Rollator Single point cane and Walker - 4 wheeled  OCCUPATION: retired  PLOF: Independent with household mobility with device  PATIENT GOALS  To walk with greater ease   OBJECTIVE:   DIAGNOSTIC FINDINGS: No imaging for the knees or hips  PATIENT SURVEYS:  FOTO: Perceived function   28%, predicted   45%   COGNITION:  Overall cognitive status: Within functional limits for tasks assessed     SENSATION: WFL  EDEMA:  None  POSTURE: rounded shoulders, forward head, and flexed trunk , knee valgus, hip IR (Knocked kneed and  Pigeon toed)  PALPATION: NT  LOWER EXTREMITY ROM:  Passive ROM Right eval Left eval  Hip flexion 60 80  Hip extension    Hip abduction    Hip adduction    Hip internal rotation Markedly limited Markedly limited  Hip external rotation Markedly limited Markedly limited  Knee flexion 70 85  Knee extension 5 lacking 12 lacking  Ankle dorsiflexion    Ankle plantarflexion    Ankle inversion    Ankle eversion     (Blank rows = not tested)  LOWER EXTREMITY MMT:  MMT Right eval Left eval  Hip flexion 2 3  Hip extension 2 2+  Hip abduction 2 3  Hip adduction    Hip internal rotation    Hip external  rotation    Knee flexion 5 5  Knee extension 5 5  Ankle dorsiflexion    Ankle plantarflexion    Ankle inversion    Ankle eversion     (Blank rows = not tested)  LOWER EXTREMITY SPECIAL TESTS:  NT  FUNCTIONAL TESTS:  Timed up and go (TUG): 44" 2 minute walk test: TBA Sit to stand is labored and requires use of arms  GAIT: Distance walked: 169ft Assistive device utilized: Environmental consultant - 4 wheeled Level of assistance: Modified independence Comments: decreased pace, postural deformities    TODAY'S TREATMENT: [facility] Adult PT Treatment:                                                DATE: 09/20/22 Therapeutic Exercise: - Sit to Stand with Counter Support % reps - 3 hold - Seated Hip Abduction with Resistance  GTB 5 reps - 3 hold each - Seated Knee Lifts with Resistance GTB 5 reps - 3 hold each  PATIENT EDUCATION:  Education details: Eval findings, POC, HEP Person educated: Patient and Caregiver sister Education method: Explanation, Demonstration, Tactile cues, and Verbal cues Education comprehension: verbalized understanding, returned demonstration, verbal cues required, tactile cues required, and needs further education   HOME EXERCISE PROGRAM: Access Code: [email or web address] URL: [email or web address] Date: 09/20/2022 Prepared by: Joellyn Rued  Exercises - Sit to Stand with Counter Support  - 2-3 x daily - 7 x weekly - 1 sets - 10 reps - 3 hold - Seated Hip Abduction with Resistance  - 2-3 x daily - 7 x weekly - 1 sets - 10 reps - 3 hold - Seated Knee Lifts with Resistance  - 2-3 x daily - 7 x weekly - 1 sets - 10 reps - 3 hold  ASSESSMENT:  CLINICAL IMPRESSION: Patient is a 71 y.o. female who was seen today for physical therapy evaluation and treatment for physical deconditioning.Eval revealed significant LE ROM and strength deficits R more so than L, and results in significant postural deficits in standing and walking, markedly knocked knee and pigeon toed. Hip and knee   changes appear to be arthritic in nature, but pt experiences little to no pain with these joints. Sit to stand is labored and pt's walking pace is very slow. Pt denies falling. Pt will benefit from skilled PT to address impairments for improved functional mobility.  OBJECTIVE IMPAIRMENTS Abnormal gait, decreased activity tolerance, decreased endurance, difficulty walking, decreased ROM, decreased strength, impaired flexibility, and postural dysfunction.   ACTIVITY LIMITATIONS carrying, lifting, bending, sitting, standing, squatting, stairs, transfers, bed mobility, bathing, toileting, dressing, hygiene/grooming, and locomotion level  PARTICIPATION LIMITATIONS: meal prep, cleaning, laundry, shopping, and community activity  PERSONAL FACTORS Age, Fitness, Past/current experiences, Time since onset of injury/illness/exacerbation, and 3+ comorbidities:    OA, osteoporosis, cataracts are also affecting patient's functional outcome.   REHAB POTENTIAL: Good  CLINICAL DECISION MAKING: Stable/uncomplicated  EVALUATION COMPLEXITY: Low   GOALS:  SHORT TERM GOALS: Target date: 10/12/2022  Pt will be Ind in an initial HEP Baseline: initiated Goal status: INITIAL  LONG TERM GOALS: Target date: 11/09/22   Pt will be Ind in a final HEP to maintain achieved LOF Baseline: initiated Goal status: INITIAL  2.  Improve TUG by 10" as indication of improved functional mobility  Baseline:  Goal status: INITIAL  3.  Improve by MCID of 52ft as indication of improved functional mobility and community ambulation Baseline: TBA Goal status: INITIAL  4.  Increase LE strength to the R to 3/5 and the L to 3+/5 for improved safety with functional mobility Baseline: see flow sheet Goal status: INITIAL  5.  Pt's FOTO score will improved to the predicted value of 45% as indication of improved function  Baseline: 28% Goal status: INITIAL  PLAN: PT FREQUENCY: 2x/week  PT DURATION: 2 weeks  PLANNED  INTERVENTIONS: Therapeutic exercises, Therapeutic activity, Balance training, Gait training, Patient/Family education, Self Care, Joint mobilization, Stair training, Aquatic Therapy, Dry Needling, Cryotherapy, Moist heat, Taping, Ultrasound, Ionotophoresis 4mg /ml Dexamethasone, Manual therapy, and Re-evaluation  PLAN FOR NEXT SESSION: Review FOTO; assess response to HEP; progress therex as indicated; use of modalities, manual therapy; and TPDN as indicated.    Nur Rabold MS, PT 09/21/22 7:36 AM  Referring diagnosis? REFERRING DIAG: physical deconditioning  Treatment diagnosis? (if different than referring diagnosis)  Difficulty in walking, not elsewhere classified  Muscle weakness (generalized)  Stiffness of left hip, not elsewhere classified  Stiffness of right hip, not elsewhere classified  Stiffness of left knee, not elsewhere classified  Stiffness of right knee, not elsewhere classified  What was this (referring dx) caused by? []  Surgery []  Fall [x]  Ongoing issue [x]  Arthritis []  Other: ____________  Laterality: []  Rt []  Lt [x]  Both  Check all possible CPT codes:  *CHOOSE 10 OR LESS*    [x]  97110 (Therapeutic Exercise)  []  92507 (SLP Treatment)  []  97112 (Neuro Re-ed)   []  92526 (Swallowing Treatment)   [x]  97116 (Gait Training)   []  09/23/22 (Cognitive Training, 1st 15 minutes) [x]  97140 (Manual Therapy)   []  97130 (Cognitive Training, each add'l 15 minutes)  [x]  97164 (Re-evaluation)                              []  Other, List CPT Code ____________  [x]  97530 (Therapeutic Activities)     [x]  97535 (Self Care)   []  All codes above (97110 - 97535)  []  97012 (Mechanical Traction)  [x]  97014 (E-stim Unattended)  [x]  97032 (E-stim manual)  [x]  (Ionto)  [x]  97035 (Ultrasound) []  97750 (Physical Performance Training) [x]  (Aquatic Therapy) []  97016 (Vasopneumatic Device) []  (Paraffin) []  97034 (Contrast Bath) []  97597 (Wound Care 1st 20 sq cm) []   97598 (Wound Care each add'l 20 sq cm) []  97760 (Orthotic Fabrication, Fitting, Training Initial) []  (Prosthetic Management and Training Initial) []  (Orthotic or Prosthetic Training/ Modification Subsequent)

## 2022-09-20 ENCOUNTER — Other Ambulatory Visit: Payer: Self-pay

## 2022-09-20 ENCOUNTER — Ambulatory Visit: Payer: Medicare HMO | Attending: Family Medicine

## 2022-09-20 DIAGNOSIS — M6281 Muscle weakness (generalized): Secondary | ICD-10-CM | POA: Diagnosis not present

## 2022-09-20 DIAGNOSIS — M25661 Stiffness of right knee, not elsewhere classified: Secondary | ICD-10-CM | POA: Diagnosis not present

## 2022-09-20 DIAGNOSIS — M25652 Stiffness of left hip, not elsewhere classified: Secondary | ICD-10-CM | POA: Insufficient documentation

## 2022-09-20 DIAGNOSIS — M25651 Stiffness of right hip, not elsewhere classified: Secondary | ICD-10-CM | POA: Insufficient documentation

## 2022-09-20 DIAGNOSIS — M25662 Stiffness of left knee, not elsewhere classified: Secondary | ICD-10-CM | POA: Diagnosis not present

## 2022-09-20 DIAGNOSIS — R262 Difficulty in walking, not elsewhere classified: Secondary | ICD-10-CM | POA: Diagnosis not present

## 2022-10-02 ENCOUNTER — Ambulatory Visit: Payer: Medicare HMO | Admitting: Physical Therapy

## 2022-10-02 ENCOUNTER — Encounter: Payer: Self-pay | Admitting: Physical Therapy

## 2022-10-02 ENCOUNTER — Telehealth: Payer: Self-pay

## 2022-10-02 DIAGNOSIS — R262 Difficulty in walking, not elsewhere classified: Secondary | ICD-10-CM | POA: Diagnosis not present

## 2022-10-02 DIAGNOSIS — M25651 Stiffness of right hip, not elsewhere classified: Secondary | ICD-10-CM | POA: Diagnosis not present

## 2022-10-02 DIAGNOSIS — M25652 Stiffness of left hip, not elsewhere classified: Secondary | ICD-10-CM | POA: Diagnosis not present

## 2022-10-02 DIAGNOSIS — M6281 Muscle weakness (generalized): Secondary | ICD-10-CM | POA: Diagnosis not present

## 2022-10-02 DIAGNOSIS — M25661 Stiffness of right knee, not elsewhere classified: Secondary | ICD-10-CM | POA: Diagnosis not present

## 2022-10-02 DIAGNOSIS — M25662 Stiffness of left knee, not elsewhere classified: Secondary | ICD-10-CM | POA: Diagnosis not present

## 2022-10-02 NOTE — Therapy (Signed)
OUTPATIENT PHYSICAL THERAPY TREATMENT NOTE   Patient Name: Jenna Kelley MRN: [id number] DOB:Jul 18, 1951, 71 y.o., female Today's Date: 10/02/2022  PCP: Lewis Moccasin, MD   REFERRING PROVIDER: Lewis Moccasin, MD    END OF SESSION:   PT End of Session - 10/02/22 1323     Visit Number 2    Number of Visits 13    Date for PT Re-Evaluation 11/09/22    Authorization Type [employer]IC[employer] HMO    PT Start Time 1317    PT Stop Time 1400    PT Time Calculation (min) 43 min             Past Medical History:  Diagnosis Date   Cataract    DVT (deep venous thrombosis) ([facility])    left leg   Osteoarthritis    shoulder   Umbilical hernia    Past Surgical History:  Procedure Laterality Date   NO PAST SURGERIES     Patient Active Problem List   Diagnosis Date Noted   Acute deep vein thrombosis (DVT) of calf muscle vein of left lower extremity ([facility]) 10/06/2019   DVT (deep venous thrombosis) ([facility]) 10/05/2019   Hypokalemia 10/05/2019    REFERRING DIAG: physical deconditioning   THERAPY DIAG:  Difficulty in walking, not elsewhere classified  Muscle weakness (generalized)  Stiffness of left hip, not elsewhere classified  Rationale for Evaluation and Treatment Rehabilitation  PERTINENT HISTORY: OA, osteoporosis, cataracts   PRECAUTIONS: Fall   SUBJECTIVE:                                                                                                                                                                                      SUBJECTIVE STATEMENT:  I did some of the exercises, not the stand up and sit down.    PAIN:  Are you having pain? No   OBJECTIVE: (objective measures completed at initial evaluation unless otherwise dated)   DIAGNOSTIC FINDINGS: No imaging for the knees or hips   PATIENT SURVEYS:  FOTO: Perceived function   28%, predicted   45%    COGNITION:           Overall cognitive status: Within functional limits for tasks  assessed                          SENSATION: WFL   EDEMA:  None   POSTURE: rounded shoulders, forward head, and flexed trunk , knee valgus, hip IR (Knocked kneed and        Pigeon toed)   PALPATION: NT   LOWER EXTREMITY ROM:   Passive ROM Right eval Left eval  Hip flexion 60 80  Hip extension      Hip abduction      Hip adduction      Hip internal rotation Markedly limited Markedly limited  Hip external rotation Markedly limited Markedly limited  Knee flexion 70 85  Knee extension 5 lacking 12 lacking  Ankle dorsiflexion      Ankle plantarflexion      Ankle inversion      Ankle eversion       (Blank rows = not tested)   LOWER EXTREMITY MMT:   MMT Right eval Left eval  Hip flexion 2 3  Hip extension 2 2+  Hip abduction 2 3  Hip adduction      Hip internal rotation      Hip external rotation      Knee flexion 5 5  Knee extension 5 5  Ankle dorsiflexion      Ankle plantarflexion      Ankle inversion      Ankle eversion       (Blank rows = not tested)   LOWER EXTREMITY SPECIAL TESTS:  NT   FUNCTIONAL TESTS:  Timed up and go (TUG): 44" 2 minute walk test: 176 feet 10/02/22 Sit to stand is labored and requires use of arms   GAIT: Distance walked: 115ft Assistive device utilized: Environmental consultant - 4 wheeled Level of assistance: Modified independence Comments: decreased pace, postural deformities       TODAY'S TREATMENT: [facility] Adult PT Treatment:                                                DATE: 10/02/22 Therapeutic Exercise: - Sit to Stand with Counter Support 10 reps  to chair with 2 airex pads- focusing on knee and hip flexion - Seated Hip Abduction with Resistance  RTB 5 reps - 3 hold each - Seated Knee Lifts with Resistance RTB 5 reps - 3 hold each- min ROM on RT  Seated LAQ x 10 each  Seated trunk flexion- much difficulty reach neutral  Supine bridge- has difficulty lifting RLE into hooklying, 10 x 2 +HEP  [facility] Adult PT Treatment:                                                 DATE: 09/20/22 Therapeutic Exercise: - Sit to Stand with Counter Support % reps - 3 hold - Seated Hip Abduction with Resistance  GTB 5 reps - 3 hold each - Seated Knee Lifts with Resistance GTB 5 reps - 3 hold each   PATIENT EDUCATION:  Education details: Eval findings, POC, HEP Person educated: Patient and Caregiver sister Education method: Explanation, Demonstration, Tactile cues, and Verbal cues Education comprehension: verbalized understanding, returned demonstration, verbal cues required, tactile cues required, and needs further education     HOME EXERCISE PROGRAM: Access Code: [email or web address] URL: [email or web address] Date: 09/20/2022 Prepared by: Joellyn Rued   Exercises - Sit to Stand with Counter Support  - 2-3 x daily - 7 x weekly - 1 sets - 10 reps - 3 hold - Seated Hip Abduction with Resistance  - 2-3 x daily - 7 x weekly - 1 sets - 10 reps - 3 hold - Seated Knee Lifts with Resistance  -  2-3 x daily - 7 x weekly - 1 sets - 10 reps - 3 hold Added 10/02/22 - Bridge 2 x per day, 7 x per week 2 sets- 10 reps   ASSESSMENT:   CLINICAL IMPRESSION: Patient is a 71 y.o. female who was seen today for physical therapy treatment for physical deconditioning.E val revealed significant LE ROM and strength deficits R more so than L, and results in significant postural deficits in standing and walking, markedly knocked knee and pigeon toed. Pt reports little to no compliance with HEP. Captured 2 MWT.  Time spent with review of HEP and education on importance of compliance to maximize outcomes.    OBJECTIVE IMPAIRMENTS Abnormal gait, decreased activity tolerance, decreased endurance, difficulty walking, decreased ROM, decreased strength, impaired flexibility, and postural dysfunction.    ACTIVITY LIMITATIONS carrying, lifting, bending, sitting, standing, squatting, stairs, transfers, bed mobility, bathing, toileting, dressing, hygiene/grooming, and  locomotion level   PARTICIPATION LIMITATIONS: meal prep, cleaning, laundry, shopping, and community activity   PERSONAL FACTORS Age, Fitness, Past/current experiences, Time since onset of injury/illness/exacerbation, and 3+ comorbidities:    OA, osteoporosis, cataracts are also affecting patient's functional outcome.    REHAB POTENTIAL: Good   CLINICAL DECISION MAKING: Stable/uncomplicated   EVALUATION COMPLEXITY: Low     GOALS:   SHORT TERM GOALS: Target date: 10/12/2022  Pt will be Ind in an initial HEP Baseline: initiated Goal status: INITIAL   LONG TERM GOALS: Target date: 11/09/22    Pt will be Ind in a final HEP to maintain achieved LOF Baseline: initiated Goal status: INITIAL   2.  Improve TUG by 10" as indication of improved functional mobility  Baseline:  Goal status: INITIAL   3.  Improve 2MWT by MCID of 30ft as indication of improved functional mobility and community ambulation Baseline: 176 feet Goal status: INITIAL   4.  Increase LE strength to the R to 3/5 and the L to 3+/5 for improved safety with functional mobility Baseline: see flow sheet Goal status: INITIAL   5.  Pt's FOTO score will improved to the predicted value of 45% as indication of improved function  Baseline: 28% Goal status: INITIAL   PLAN: PT FREQUENCY: 2x/week   PT DURATION: 2 weeks   PLANNED INTERVENTIONS: Therapeutic exercises, Therapeutic activity, Balance training, Gait training, Patient/Family education, Self Care, Joint mobilization, Stair training, Aquatic Therapy, Dry Needling, Cryotherapy, Moist heat, Taping, Ultrasound, Ionotophoresis 4mg /ml Dexamethasone, Manual therapy, and Re-evaluation   PLAN FOR NEXT SESSION: Review FOTO; assess response to HEP; progress therex as indicated; use of modalities, manual therapy; and TPDN as indicated. Lumbar flexion stretching       Hessie Diener, PTA 10/02/22 2:24 PM Phone: [phone] Fax: [phone]

## 2022-10-02 NOTE — Telephone Encounter (Signed)
     Patient  visit on 09/15/2022  at [facility] was for Hallucinations  Foot Pain.  Have you been able to follow up with your primary care physician? Yes  The patient was or was not able to obtain any needed medicine or equipment. No meds prescribed.  Are there diet recommendations that you are having difficulty following? No  Patient expresses understanding of discharge instructions and education provided has no other needs at this time.    [place] Resource Care Guide   [email or web address]  ?? [phone]   [email or web address]: [email or web address]  [email or web address]

## 2022-10-08 ENCOUNTER — Ambulatory Visit: Payer: Medicare HMO | Attending: Family Medicine | Admitting: Physical Therapy

## 2022-10-08 DIAGNOSIS — M6281 Muscle weakness (generalized): Secondary | ICD-10-CM | POA: Diagnosis not present

## 2022-10-08 DIAGNOSIS — M25662 Stiffness of left knee, not elsewhere classified: Secondary | ICD-10-CM | POA: Insufficient documentation

## 2022-10-08 DIAGNOSIS — R262 Difficulty in walking, not elsewhere classified: Secondary | ICD-10-CM

## 2022-10-08 DIAGNOSIS — M25651 Stiffness of right hip, not elsewhere classified: Secondary | ICD-10-CM | POA: Diagnosis not present

## 2022-10-08 DIAGNOSIS — M25652 Stiffness of left hip, not elsewhere classified: Secondary | ICD-10-CM

## 2022-10-08 DIAGNOSIS — M25661 Stiffness of right knee, not elsewhere classified: Secondary | ICD-10-CM | POA: Insufficient documentation

## 2022-10-08 NOTE — Therapy (Signed)
OUTPATIENT PHYSICAL THERAPY TREATMENT NOTE   Patient Name: Jenna Kelley MRN: [id number] DOB:23-Jul-1951, 71 y.o., female Today's Date: 10/08/2022  PCP: Lewis Moccasin, MD   REFERRING PROVIDER: Lewis Moccasin, MD    END OF SESSION:   PT End of Session - 10/08/22 0842     Visit Number 3    Number of Visits 13    Date for PT Re-Evaluation 11/09/22    Authorization Type [employer]    PT Start Time 0845    PT Stop Time 0928    PT Time Calculation (min) 43 min             Past Medical History:  Diagnosis Date   Cataract    DVT (deep venous thrombosis) (HCC)    left leg   Osteoarthritis    shoulder   Umbilical hernia    Past Surgical History:  Procedure Laterality Date   NO PAST SURGERIES     Patient Active Problem List   Diagnosis Date Noted   Acute deep vein thrombosis (DVT) of calf muscle vein of left lower extremity (HCC) 10/06/2019   DVT (deep venous thrombosis) (HCC) 10/05/2019   Hypokalemia 10/05/2019    REFERRING DIAG: physical deconditioning   THERAPY DIAG:  Difficulty in walking, not elsewhere classified  Muscle weakness (generalized)  Stiffness of left hip, not elsewhere classified  Stiffness of right hip, not elsewhere classified  Rationale for Evaluation and Treatment Rehabilitation  PERTINENT HISTORY: OA, osteoporosis, cataracts   PRECAUTIONS: Fall   SUBJECTIVE:                                                                                                                                                                                      SUBJECTIVE STATEMENT:  I did some of the exercises, not the stand up and sit down.    PAIN:  Are you having pain? No   OBJECTIVE: (objective measures completed at initial evaluation unless otherwise dated)   DIAGNOSTIC FINDINGS: No imaging for the knees or hips   PATIENT SURVEYS:  FOTO: Perceived function   28%, predicted   45%    COGNITION:           Overall  cognitive status: Within functional limits for tasks assessed                          SENSATION: WFL   EDEMA:  None   POSTURE: rounded shoulders, forward head, and flexed trunk , knee valgus, hip IR (Knocked kneed and        Pigeon toed)   PALPATION: NT   LOWER EXTREMITY ROM:  Passive ROM Right eval Left eval  Hip flexion 60 80  Hip extension      Hip abduction      Hip adduction      Hip internal rotation Markedly limited Markedly limited  Hip external rotation Markedly limited Markedly limited  Knee flexion 70 85  Knee extension 5 lacking 12 lacking  Ankle dorsiflexion      Ankle plantarflexion      Ankle inversion      Ankle eversion       (Blank rows = not tested)   LOWER EXTREMITY MMT:   MMT Right eval Left eval  Hip flexion 2 3  Hip extension 2 2+  Hip abduction 2 3  Hip adduction      Hip internal rotation      Hip external rotation      Knee flexion 5 5  Knee extension 5 5  Ankle dorsiflexion      Ankle plantarflexion      Ankle inversion      Ankle eversion       (Blank rows = not tested)   LOWER EXTREMITY SPECIAL TESTS:  NT   FUNCTIONAL TESTS:  Timed up and go (TUG): 44" 2 minute walk test: 176 feet 10/02/22 Sit to stand is labored and requires use of arms   GAIT: Distance walked: 136ft Assistive device utilized: Environmental consultant - 4 wheeled Level of assistance: Modified independence Comments: decreased pace, postural deformities       TODAY'S TREATMENT: [facility] Adult PT Treatment:                                                DATE: 10/08/22 Therapeutic Exercise: Seated heel slides with cloth 10 x 2 each LAQ 3# 10 x 2 each  Seated RTB clam and march 10 x 2 each Seated hamstring stretch with strap 3 x 30 sec each Seated lumbar flexion stretching with rollator 10 sec x 5  STS at sink , 2 airex pads, 1 airex pads 10 x 2 - max cues for hip hinge Nustep L3 UE/LE x 5 minutes     [facility] Adult PT Treatment:                                                 DATE: 10/02/22 Therapeutic Exercise: - Sit to Stand with Counter Support 10 reps  to chair with 2 airex pads- focusing on knee and hip flexion - Seated Hip Abduction with Resistance  RTB 5 reps - 3 hold each - Seated Knee Lifts with Resistance RTB 5 reps - 3 hold each- min ROM on RT  Seated LAQ x 10 each  Seated trunk flexion- much difficulty reach neutral  Supine bridge- has difficulty lifting RLE into hooklying, 10 x 2 +HEP  [facility] Adult PT Treatment:                                                DATE: 09/20/22 Therapeutic Exercise: - Sit to Stand with Counter Support % reps - 3 hold - Seated Hip Abduction with Resistance  GTB 5  reps - 3 hold each - Seated Knee Lifts with Resistance GTB 5 reps - 3 hold each   PATIENT EDUCATION:  Education details: Eval findings, POC, HEP Person educated: Patient and Caregiver sister Education method: Explanation, Demonstration, Tactile cues, and Verbal cues Education comprehension: verbalized understanding, returned demonstration, verbal cues required, tactile cues required, and needs further education     HOME EXERCISE PROGRAM: Access Code: [email or web address] URL: [email or web address] Date: 09/20/2022 Prepared by: Gar Ponto   Exercises - Sit to Stand with Counter Support  - 2-3 x daily - 7 x weekly - 1 sets - 10 reps - 3 hold - Seated Hip Abduction with Resistance  - 2-3 x daily - 7 x weekly - 1 sets - 10 reps - 3 hold - Seated Knee Lifts with Resistance  - 2-3 x daily - 7 x weekly - 1 sets - 10 reps - 3 hold Added 10/02/22 - Bridge 2 x per day, 7 x per week 2 sets- 10 reps   ASSESSMENT:   CLINICAL IMPRESSION: Patient is a 71 y.o. female who was seen today for physical therapy treatment for physical deconditioning.E val revealed significant LE ROM and strength deficits R more so than L, and results in significant postural deficits in standing and walking, markedly knocked knee and pigeon toed. Pt reports min compliance with HEP.  She requires increased time to stand from her rollator seat in the lobby. Continued with Hip and knee strengthening and hip/lumbar flexion stretches. She tolerated session well without increased pain.    OBJECTIVE IMPAIRMENTS Abnormal gait, decreased activity tolerance, decreased endurance, difficulty walking, decreased ROM, decreased strength, impaired flexibility, and postural dysfunction.    ACTIVITY LIMITATIONS carrying, lifting, bending, sitting, standing, squatting, stairs, transfers, bed mobility, bathing, toileting, dressing, hygiene/grooming, and locomotion level   PARTICIPATION LIMITATIONS: meal prep, cleaning, laundry, shopping, and community activity   PERSONAL FACTORS Age, Fitness, Past/current experiences, Time since onset of injury/illness/exacerbation, and 3+ comorbidities:    OA, osteoporosis, cataracts are also affecting patient's functional outcome.    REHAB POTENTIAL: Good   CLINICAL DECISION MAKING: Stable/uncomplicated   EVALUATION COMPLEXITY: Low     GOALS:   SHORT TERM GOALS: Target date: 10/12/2022  Pt will be Ind in an initial HEP Baseline: initiated Goal status: INITIAL   LONG TERM GOALS: Target date: 11/09/22    Pt will be Ind in a final HEP to maintain achieved LOF Baseline: initiated Goal status: INITIAL   2.  Improve TUG by 10" as indication of improved functional mobility  Baseline:  Goal status: INITIAL   3.  Improve 2MWT by MCID of 8ft as indication of improved functional mobility and community ambulation Baseline: 176 feet Goal status: INITIAL   4.  Increase LE strength to the R to 3/5 and the L to 3+/5 for improved safety with functional mobility Baseline: see flow sheet Goal status: INITIAL   5.  Pt's FOTO score will improved to the predicted value of 45% as indication of improved function  Baseline: 28% Goal status: INITIAL   PLAN: PT FREQUENCY: 2x/week   PT DURATION: 2 weeks   PLANNED INTERVENTIONS: Therapeutic exercises,  Therapeutic activity, Balance training, Gait training, Patient/Family education, Self Care, Joint mobilization, Stair training, Aquatic Therapy, Dry Needling, Cryotherapy, Moist heat, Taping, Ultrasound, Ionotophoresis 4mg /ml Dexamethasone, Manual therapy, and Re-evaluation   PLAN FOR NEXT SESSION: assess response to HEP; progress therex as indicated; use of modalities, manual therapy; and TPDN as indicated. Lumbar flexion stretching       Janett Billow  Lars Mage, PTA 10/08/22 11:29 AM Phone: [phone] Fax: [phone]

## 2022-10-10 ENCOUNTER — Ambulatory Visit: Payer: Medicare HMO

## 2022-10-10 DIAGNOSIS — M25661 Stiffness of right knee, not elsewhere classified: Secondary | ICD-10-CM | POA: Diagnosis not present

## 2022-10-10 DIAGNOSIS — M25652 Stiffness of left hip, not elsewhere classified: Secondary | ICD-10-CM | POA: Diagnosis not present

## 2022-10-10 DIAGNOSIS — M25651 Stiffness of right hip, not elsewhere classified: Secondary | ICD-10-CM

## 2022-10-10 DIAGNOSIS — M6281 Muscle weakness (generalized): Secondary | ICD-10-CM

## 2022-10-10 DIAGNOSIS — R262 Difficulty in walking, not elsewhere classified: Secondary | ICD-10-CM

## 2022-10-10 DIAGNOSIS — M25662 Stiffness of left knee, not elsewhere classified: Secondary | ICD-10-CM | POA: Diagnosis not present

## 2022-10-10 NOTE — Therapy (Signed)
OUTPATIENT PHYSICAL THERAPY TREATMENT NOTE   Patient Name: Jenna Kelley MRN: [id number] DOB:Apr 10, 1951, 71 y.o., female Today's Date: 10/10/2022  PCP: Lewis Moccasin, MD   REFERRING PROVIDER: Lewis Moccasin, MD    END OF SESSION:   PT End of Session - 10/10/22 0932     Visit Number 4    Number of Visits 13    Date for PT Re-Evaluation 11/09/22    Authorization Type [employer]    PT Start Time 0830    PT Stop Time 0912    PT Time Calculation (min) 42 min    Equipment Utilized During Treatment Other (comment)   RW   Activity Tolerance Patient tolerated treatment well    Behavior During Therapy WFL for tasks assessed/performed              Past Medical History:  Diagnosis Date   Cataract    DVT (deep venous thrombosis) (HCC)    left leg   Osteoarthritis    shoulder   Umbilical hernia    Past Surgical History:  Procedure Laterality Date   NO PAST SURGERIES     Patient Active Problem List   Diagnosis Date Noted   Acute deep vein thrombosis (DVT) of calf muscle vein of left lower extremity (HCC) 10/06/2019   DVT (deep venous thrombosis) (HCC) 10/05/2019   Hypokalemia 10/05/2019    REFERRING DIAG: physical deconditioning   THERAPY DIAG:  Difficulty in walking, not elsewhere classified  Muscle weakness (generalized)  Stiffness of left hip, not elsewhere classified  Stiffness of right hip, not elsewhere classified  Stiffness of left knee, not elsewhere classified  Stiffness of right knee, not elsewhere classified  Rationale for Evaluation and Treatment Rehabilitation  PERTINENT HISTORY: OA, osteoporosis, cataracts   PRECAUTIONS: Fall   SUBJECTIVE:                                                                                                                                                                                      SUBJECTIVE STATEMENT:  I'm doing a little better. I'm doing some exs everyday, but usually I do a  session of most my home exs every other day.   PAIN: . Are you having pain? No   OBJECTIVE: (objective measures completed at initial evaluation unless otherwise dated)   DIAGNOSTIC FINDINGS: No imaging for the knees or hips   PATIENT SURVEYS:  FOTO: Perceived function   28%, predicted   45%    COGNITION:           Overall cognitive status: Within functional limits for tasks assessed  SENSATION: WFL   EDEMA:  None   POSTURE: rounded shoulders, forward head, and flexed trunk , knee valgus, hip IR (Knocked kneed and        Pigeon toed)   PALPATION: NT   LOWER EXTREMITY ROM:   Passive ROM Right eval Left eval  Hip flexion 60 80  Hip extension      Hip abduction      Hip adduction      Hip internal rotation Markedly limited Markedly limited  Hip external rotation Markedly limited Markedly limited  Knee flexion 70 85  Knee extension 5 lacking 12 lacking  Ankle dorsiflexion      Ankle plantarflexion      Ankle inversion      Ankle eversion       (Blank rows = not tested)   LOWER EXTREMITY MMT:   MMT Right eval Left eval  Hip flexion 2 3  Hip extension 2 2+  Hip abduction 2 3  Hip adduction      Hip internal rotation      Hip external rotation      Knee flexion 5 5  Knee extension 5 5  Ankle dorsiflexion      Ankle plantarflexion      Ankle inversion      Ankle eversion       (Blank rows = not tested)   LOWER EXTREMITY SPECIAL TESTS:  NT   FUNCTIONAL TESTS:  Timed up and go (TUG): 44" 2 minute walk test: 176 feet 10/02/22 Sit to stand is labored and requires use of arms   GAIT: Distance walked: 161ft Assistive device utilized: Environmental consultant - 4 wheeled Level of assistance: Modified independence Comments: decreased pace, postural deformities       TODAY'S TREATMENT: [facility] Adult PT Treatment:                                                DATE: 10/10/22 Therapeutic Exercise: Nustep L3 UE/LE x 5 minutes  STS from armchair x3 c  CGA,  Seated heel slides with cloth 10 x 2 each LAQ 3# 10 x 2 each  Seated RTB clam 10 x 2 each Seated hamstring stretch with strap 3 x 30 sec each Seated lumbar flexion stretching with hand pulls on the edge of the mat table Seated marching 2x15, holding hands on front edge of mat table  [facility] Adult PT Treatment:                                                DATE: 10/08/22 Therapeutic Exercise: Seated heel slides with cloth 10 x 2 each LAQ 3# 10 x 2 each  Seated RTB clam and march 10 x 2 each Seated hamstring stretch with strap 3 x 30 sec each Seated lumbar flexion stretching with rollator 10 sec x 5  STS at sink , 2 airex pads, 1 airex pads 10 x 2 - max cues for hip hinge Nustep L3 UE/LE x 5 minutes   [facility] Adult PT Treatment:  DATE: 10/02/22 Therapeutic Exercise: - Sit to Stand with Counter Support 10 reps  to chair with 2 airex pads- focusing on knee and hip flexion - Seated Hip Abduction with Resistance  RTB 5 reps - 3 hold each - Seated Knee Lifts with Resistance RTB 5 reps - 3 hold each- min ROM on RT  Seated LAQ x 10 each  Seated trunk flexion- much difficulty reach neutral  Supine bridge- has difficulty lifting RLE into hooklying, 10 x 2 +HEP     PATIENT EDUCATION:  Education details: Eval findings, POC, HEP Person educated: Patient and Caregiver sister Education method: Explanation, Demonstration, Tactile cues, and Verbal cues Education comprehension: verbalized understanding, returned demonstration, verbal cues required, tactile cues required, and needs further education     HOME EXERCISE PROGRAM: Access Code: [email or web address] URL: [email or web address] Date: 09/20/2022 Prepared by: Joellyn Rued   Exercises - Sit to Stand with Counter Support  - 2-3 x daily - 7 x weekly - 1 sets - 10 reps - 3 hold - Seated Hip Abduction with Resistance  - 2-3 x daily - 7 x weekly - 1 sets - 10 reps - 3 hold - Seated Knee Lifts  with Resistance  - 2-3 x daily - 7 x weekly - 1 sets - 10 reps - 3 hold Added 10/02/22 - Bridge 2 x per day, 7 x per week 2 sets- 10 reps   ASSESSMENT:   CLINICAL IMPRESSION: PT was completed for low back/hip/knee flexibility and strengthening. Due to significantly limited hip and knee flexion pt has difficulty leaning forward with her trunk to get her COG over her feet. Due to this, pt needs significant UE assist for STS and assist from this PT, esp from a lower sitting surface. Due to chronicity of pt's impairments, anticipate a slow course of progress. Pt tolerated PT today without adverse effects. Pt will continue to benefit from skilled PT to address impairments for improved function.  OBJECTIVE IMPAIRMENTS Abnormal gait, decreased activity tolerance, decreased endurance, difficulty walking, decreased ROM, decreased strength, impaired flexibility, and postural dysfunction.    ACTIVITY LIMITATIONS carrying, lifting, bending, sitting, standing, squatting, stairs, transfers, bed mobility, bathing, toileting, dressing, hygiene/grooming, and locomotion level   PARTICIPATION LIMITATIONS: meal prep, cleaning, laundry, shopping, and community activity   PERSONAL FACTORS Age, Fitness, Past/current experiences, Time since onset of injury/illness/exacerbation, and 3+ comorbidities:    OA, osteoporosis, cataracts are also affecting patient's functional outcome.    REHAB POTENTIAL: Good   CLINICAL DECISION MAKING: Stable/uncomplicated   EVALUATION COMPLEXITY: Low     GOALS:   SHORT TERM GOALS: Target date: 10/12/2022  Pt will be Ind in an initial HEP Baseline: initiated Goal status: INITIAL   LONG TERM GOALS: Target date: 11/09/22    Pt will be Ind in a final HEP to maintain achieved LOF Baseline: initiated Goal status: INITIAL   2.  Improve TUG by 10" as indication of improved functional mobility  Baseline:  Goal status: INITIAL   3.  Improve by MCID of 24ft as indication of  improved functional mobility and community ambulation Baseline: 176 feet Goal status: INITIAL   4.  Increase LE strength to the R to 3/5 and the L to 3+/5 for improved safety with functional mobility Baseline: see flow sheet Goal status: INITIAL   5.  Pt's FOTO score will improved to the predicted value of 45% as indication of improved function  Baseline: 28% Goal status: INITIAL   PLAN: PT FREQUENCY: 2x/week   PT DURATION: 6  weeks   PLANNED INTERVENTIONS: Therapeutic exercises, Therapeutic activity, Balance training, Gait training, Patient/Family education, Self Care, Joint mobilization, Stair training, Aquatic Therapy, Dry Needling, Cryotherapy, Moist heat, Taping, Ultrasound, Ionotophoresis 4mg /ml Dexamethasone, Manual therapy, and Re-evaluation   PLAN FOR NEXT SESSION: Progress therex as indicated; use of modalities, manual therapy; and TPDN as indicated. Lumbar flexion stretching. Assess pt's ability to complete HEP.     Leighton Brickley MS, PT 10/10/22 10:39 AM

## 2022-10-11 DIAGNOSIS — F331 Major depressive disorder, recurrent, moderate: Secondary | ICD-10-CM | POA: Diagnosis not present

## 2022-10-11 DIAGNOSIS — F411 Generalized anxiety disorder: Secondary | ICD-10-CM | POA: Diagnosis not present

## 2022-10-11 DIAGNOSIS — G4700 Insomnia, unspecified: Secondary | ICD-10-CM | POA: Diagnosis not present

## 2022-10-23 ENCOUNTER — Ambulatory Visit: Payer: Medicare HMO | Admitting: Physical Therapy

## 2022-10-23 ENCOUNTER — Encounter: Payer: Medicare HMO | Admitting: Physical Therapy

## 2022-10-23 ENCOUNTER — Encounter: Payer: Self-pay | Admitting: Physical Therapy

## 2022-10-23 DIAGNOSIS — M25651 Stiffness of right hip, not elsewhere classified: Secondary | ICD-10-CM | POA: Diagnosis not present

## 2022-10-23 DIAGNOSIS — R262 Difficulty in walking, not elsewhere classified: Secondary | ICD-10-CM

## 2022-10-23 DIAGNOSIS — M6281 Muscle weakness (generalized): Secondary | ICD-10-CM | POA: Diagnosis not present

## 2022-10-23 DIAGNOSIS — M25662 Stiffness of left knee, not elsewhere classified: Secondary | ICD-10-CM | POA: Diagnosis not present

## 2022-10-23 DIAGNOSIS — M25661 Stiffness of right knee, not elsewhere classified: Secondary | ICD-10-CM | POA: Diagnosis not present

## 2022-10-23 DIAGNOSIS — M25652 Stiffness of left hip, not elsewhere classified: Secondary | ICD-10-CM | POA: Diagnosis not present

## 2022-10-23 NOTE — Therapy (Signed)
OUTPATIENT PHYSICAL THERAPY TREATMENT NOTE   Patient Name: Jenna Kelley MRN: [id number] DOB:[birth year], 71 y.o., female Today's Date: 10/23/2022  PCP: Fanny Bien, MD   REFERRING PROVIDER: Fanny Bien, MD    END OF SESSION:   PT End of Session - 10/23/22 1325     Visit Number 5    Number of Visits 13    Date for PT Re-Evaluation 11/09/22    Authorization Type [employer]    PT Start Time 1315    PT Stop Time 1400    PT Time Calculation (min) 45 min              Past Medical History:  Diagnosis Date   Cataract    DVT (deep venous thrombosis) ([place])    left leg   Osteoarthritis    shoulder   Umbilical hernia    Past Surgical History:  Procedure Laterality Date   NO PAST SURGERIES     Patient Active Problem List   Diagnosis Date Noted   Acute deep vein thrombosis (DVT) of calf muscle vein of left lower extremity ([place]) 10/06/2019   DVT (deep venous thrombosis) ([place]) 10/05/2019   Hypokalemia 10/05/2019    REFERRING DIAG: physical deconditioning   THERAPY DIAG:  Difficulty in walking, not elsewhere classified  Muscle weakness (generalized)  Stiffness of left hip, not elsewhere classified  Stiffness of right hip, not elsewhere classified  Rationale for Evaluation and Treatment Rehabilitation  PERTINENT HISTORY: OA, osteoporosis, cataracts   PRECAUTIONS: Fall   SUBJECTIVE:                                                                                                                                                                                      SUBJECTIVE STATEMENT: This might be my last visit. It seems like I am doing worse. I was walking at the [facility] without a walker earlier this year.    PAIN: . Are you having pain? No   OBJECTIVE: (objective measures completed at initial evaluation unless otherwise dated)   DIAGNOSTIC FINDINGS: No imaging for the knees or hips   PATIENT SURVEYS:  FOTO: Perceived  function   28%, predicted   45%    COGNITION:           Overall cognitive status: Within functional limits for tasks assessed                          SENSATION: WFL   EDEMA:  None   POSTURE: rounded shoulders, forward head, and flexed trunk , knee valgus, hip IR (Knocked kneed and  Pigeon toed)   PALPATION: NT   LOWER EXTREMITY ROM:   Passive ROM Right eval Left eval Right 10/23/22 Left 10/23/22  Hip flexion 60 80 65 95  Hip extension        Hip abduction     0 0  Hip adduction        Hip internal rotation Markedly limited Markedly limited    Hip external rotation Markedly limited Markedly limited    Knee flexion 70 85    Knee extension 5 lacking 12 lacking    Ankle dorsiflexion        Ankle plantarflexion        Ankle inversion        Ankle eversion         (Blank rows = not tested)   LOWER EXTREMITY MMT:   MMT Right eval Left eval Right 10/23/22 Left 10/23/22  Hip flexion 2 3 2+ 3+  Hip extension 2 2+    Hip abduction 2 3    Hip adduction        Hip internal rotation        Hip external rotation        Knee flexion 5 5    Knee extension 5 5    Ankle dorsiflexion        Ankle plantarflexion        Ankle inversion        Ankle eversion         (Blank rows = not tested)   LOWER EXTREMITY SPECIAL TESTS:  NT   FUNCTIONAL TESTS:  Timed up and go (TUG): 44" 2 minute walk test: 176 feet 10/02/22; 10/23/22: 108 feet  Sit to stand is labored and requires use of arms   GAIT: Distance walked: 142f Assistive device utilized: WEnvironmental consultant- 4 wheeled Level of assistance: Modified independence Comments: decreased pace, postural deformities       TODAY'S TREATMENT: [facility] Adult PT Treatment:                                                DATE: 10/23/22 Therapeutic Exercise: Nustep L2 UE/LE x 5 minutes  2 MWT 108 Feet  LAQ 3# each  Hip Flexion Alternating, 3# on Lt Supine heel slide Right and left - improved ability for active Right heel  slides Passive knee to chest  Passive hip abduction    [facility] Adult PT Treatment:                                                DATE: 10/10/22 Therapeutic Exercise: Nustep L3 UE/LE x 5 minutes  STS from armchair x3 c CGA,  Seated heel slides with cloth 10 x 2 each LAQ 3# 10 x 2 each  Seated RTB clam 10 x 2 each Seated hamstring stretch with strap 3 x 30 sec each Seated lumbar flexion stretching with hand pulls on the edge of the mat table Seated marching 2x15, holding hands on front edge of mat table  [facility]  [facility]22 Therapeutic Exercise: Seated heel slides with cloth 10 x 2 each LAQ 3# 10 x 2 each  Seated RTB clam and march 10 x 2 each Seated hamstring stretch with strap 3 x 30 sec each Seated lumbar flexion stretching with rollator 10 sec x 5  STS at sink , 2 airex pads, 1 airex pads 10 x 2 - max cues for hip hinge Nustep L3 UE/LE x 5 minutes   [facility] Adult PT Treatment:                                                DATE: 10/02/22 Therapeutic Exercise: - Sit to Stand with Counter Support 10 reps  to chair with 2 airex pads- focusing on knee and hip flexion - Seated Hip Abduction with Resistance  RTB 5 reps - 3 hold each - Seated Knee Lifts with Resistance RTB 5 reps - 3 hold each- min ROM on RT  Seated LAQ x 10 each  Seated trunk flexion- much difficulty reach neutral  Supine bridge- has difficulty lifting RLE into hooklying, 10 x 2 +HEP     PATIENT EDUCATION:  Education details: Eval findings, POC, HEP Person educated: Patient and Caregiver sister Education method: Explanation, Demonstration, Tactile cues, and Verbal cues Education comprehension: verbalized understanding, returned demonstration, verbal cues required, tactile cues required, and needs further education     HOME EXERCISE PROGRAM: Access Code: [email or web address] URL: [email or web address] Date: 09/20/2022 Prepared by: Gar Ponto    Exercises - Sit to Stand with Counter Support  - 2-3 x daily - 7 x weekly - 1 sets - 10 reps - 3 hold - Seated Hip Abduction with Resistance  - 2-3 x daily - 7 x weekly - 1 sets - 10 reps - 3 hold - Seated Knee Lifts with Resistance  - 2-3 x daily - 7 x weekly - 1 sets - 10 reps - 3 hold Added 10/02/22 - Bridge 2 x per day, 7 x per week 2 sets- 10 reps   ASSESSMENT:   CLINICAL IMPRESSION: PT was completed for low back/hip/knee flexibility and strengthening. Pt reports depression on arrival regarding her lack of progress. She demonstrates decreased 2 MWT today however was also distracted and not performing at her best. She does demonstrate increased hip flexion strength and PROM although still markedly limited R > L. Her Right hip Passive flexion is limited to 65 degrees and hinders her ability to hinge at her hips. She was able to complete active right heel slides in supine today. Encouragement provided to finish POC and allow more progress. Pt tolerated PT today without adverse effects. Pt will continue to benefit from skilled PT to address impairments for improved function.  OBJECTIVE IMPAIRMENTS Abnormal gait, decreased activity tolerance, decreased endurance, difficulty walking, decreased ROM, decreased strength, impaired flexibility, and postural dysfunction.    ACTIVITY LIMITATIONS carrying, lifting, bending, sitting, standing, squatting, stairs, transfers, bed mobility, bathing, toileting, dressing, hygiene/grooming, and locomotion level   PARTICIPATION LIMITATIONS: meal prep, cleaning, laundry, shopping, and community activity   PERSONAL FACTORS Age, Fitness, Past/current experiences, Time since onset of injury/illness/exacerbation, and 3+ comorbidities:    OA, osteoporosis, cataracts are also affecting patient's functional outcome.    REHAB POTENTIAL: Good   CLINICAL DECISION MAKING: Stable/uncomplicated   EVALUATION COMPLEXITY: Low     GOALS:   SHORT TERM GOALS: Target date:  10/12/2022  Pt will be Ind in an initial HEP Baseline: initiated Status: 10/23/22: verbally reports compliance  Goal status: PARTIALLY MET   LONG TERM GOALS: Target date: 11/09/22    Pt will be Ind in a final HEP to maintain achieved LOF Baseline: initiated Goal status: INITIAL   2.  Improve TUG by 10" as indication of improved functional mobility  Baseline:  Goal status: INITIAL   3.  Improve 2MWT by MCID of 57f as indication of improved functional mobility and community ambulation Baseline: 176 feet Status: 10/23/22 : 108 feet Goal status: INITIAL   4.  Increase LE strength to the R to 3/5 and the L to 3+/5 for improved safety with functional mobility Baseline: see flow sheet Status: 10/23/22: Left hip flexion 3+/5 Goal status: ONGOING   5.  Pt's FOTO score will improved to the predicted value of 45% as indication of improved function  Baseline: 28% Goal status: INITIAL   PLAN: PT FREQUENCY: 2x/week   PT DURATION: 6 weeks   PLANNED INTERVENTIONS: Therapeutic exercises, Therapeutic activity, Balance training, Gait training, Patient/Family education, Self Care, Joint mobilization, Stair training, Aquatic Therapy, Dry Needling, Cryotherapy, Moist heat, Taping, Ultrasound, Ionotophoresis 442mml Dexamethasone, Manual therapy, and Re-evaluation   PLAN FOR NEXT SESSION: FOTO, passive Hip ROM, Progress therex as indicated; use of modalities, manual therapy; and TPDN as indicated.  Assess pt's ability to complete HEP.     JeHessie DienerPTA 10/23/22 2:17 PM Phone: [phone]: [phone]

## 2022-10-29 DIAGNOSIS — E782 Mixed hyperlipidemia: Secondary | ICD-10-CM | POA: Diagnosis not present

## 2022-10-29 DIAGNOSIS — E559 Vitamin D deficiency, unspecified: Secondary | ICD-10-CM | POA: Diagnosis not present

## 2022-10-29 DIAGNOSIS — I10 Essential (primary) hypertension: Secondary | ICD-10-CM | POA: Diagnosis not present

## 2022-10-31 DIAGNOSIS — M1990 Unspecified osteoarthritis, unspecified site: Secondary | ICD-10-CM | POA: Diagnosis not present

## 2022-10-31 DIAGNOSIS — Z6824 Body mass index (BMI) 24.0-24.9, adult: Secondary | ICD-10-CM | POA: Diagnosis not present

## 2022-10-31 DIAGNOSIS — G4700 Insomnia, unspecified: Secondary | ICD-10-CM | POA: Diagnosis not present

## 2022-10-31 DIAGNOSIS — R5381 Other malaise: Secondary | ICD-10-CM | POA: Diagnosis not present

## 2022-10-31 DIAGNOSIS — E782 Mixed hyperlipidemia: Secondary | ICD-10-CM | POA: Diagnosis not present

## 2022-10-31 DIAGNOSIS — E559 Vitamin D deficiency, unspecified: Secondary | ICD-10-CM | POA: Diagnosis not present

## 2022-10-31 DIAGNOSIS — F411 Generalized anxiety disorder: Secondary | ICD-10-CM | POA: Diagnosis not present

## 2022-11-02 ENCOUNTER — Ambulatory Visit: Payer: Medicare HMO | Attending: Family Medicine

## 2022-11-02 DIAGNOSIS — R262 Difficulty in walking, not elsewhere classified: Secondary | ICD-10-CM | POA: Diagnosis not present

## 2022-11-02 DIAGNOSIS — M25662 Stiffness of left knee, not elsewhere classified: Secondary | ICD-10-CM | POA: Diagnosis not present

## 2022-11-02 DIAGNOSIS — M25652 Stiffness of left hip, not elsewhere classified: Secondary | ICD-10-CM | POA: Diagnosis not present

## 2022-11-02 DIAGNOSIS — M6281 Muscle weakness (generalized): Secondary | ICD-10-CM

## 2022-11-02 DIAGNOSIS — M25661 Stiffness of right knee, not elsewhere classified: Secondary | ICD-10-CM | POA: Diagnosis not present

## 2022-11-02 DIAGNOSIS — M25651 Stiffness of right hip, not elsewhere classified: Secondary | ICD-10-CM | POA: Diagnosis not present

## 2022-11-02 NOTE — Therapy (Signed)
OUTPATIENT PHYSICAL THERAPY TREATMENT NOTE   Patient Name: Jenna Kelley MRN: [id number] DOB:[birth year], 71 y.o., female Today's Date: 11/02/2022  PCP: Fanny Bien, MD   REFERRING PROVIDER: Fanny Bien, MD    END OF SESSION:   PT End of Session - 11/02/22 0930     Visit Number 6    Number of Visits 13    Date for PT Re-Evaluation 11/09/22    Authorization Type [employer]    Progress Note Due on Visit 10    PT Start Time 0930    PT Stop Time 1015    PT Time Calculation (min) 45 min    Equipment Utilized During Treatment Other (comment)   rollator   Activity Tolerance Patient tolerated treatment well    Behavior During Therapy WFL for tasks assessed/performed               Past Medical History:  Diagnosis Date   Cataract    DVT (deep venous thrombosis) ([place])    left leg   Osteoarthritis    shoulder   Umbilical hernia    Past Surgical History:  Procedure Laterality Date   NO PAST SURGERIES     Patient Active Problem List   Diagnosis Date Noted   Acute deep vein thrombosis (DVT) of calf muscle vein of left lower extremity ([place]) 10/06/2019   DVT (deep venous thrombosis) ([place]) 10/05/2019   Hypokalemia 10/05/2019    REFERRING DIAG: physical deconditioning   THERAPY DIAG:  Difficulty in walking, not elsewhere classified  Muscle weakness (generalized)  Stiffness of left hip, not elsewhere classified  Stiffness of right hip, not elsewhere classified  Stiffness of left knee, not elsewhere classified  Stiffness of right knee, not elsewhere classified  Rationale for Evaluation and Treatment Rehabilitation  PERTINENT HISTORY: OA, osteoporosis, cataracts   PRECAUTIONS: Fall   SUBJECTIVE:                                                                                                                                                                                      SUBJECTIVE STATEMENT: Pt reports she her R leg is moving  some better.    PAIN: . Are you having pain? No   OBJECTIVE: (objective measures completed at initial evaluation unless otherwise dated)   DIAGNOSTIC FINDINGS: No imaging for the knees or hips   PATIENT SURVEYS:  FOTO: Perceived function   28%, predicted   45%    COGNITION:           Overall cognitive status: Within functional limits for tasks assessed  SENSATION: WFL   EDEMA:  None   POSTURE: rounded shoulders, forward head, and flexed trunk , knee valgus, hip IR (Knocked kneed and        Pigeon toed)   PALPATION: NT   LOWER EXTREMITY ROM:   Passive ROM Right eval Left eval Right 10/23/22 Left 10/23/22  Hip flexion 60 80 65 95  Hip extension        Hip abduction     0 0  Hip adduction        Hip internal rotation Markedly limited Markedly limited    Hip external rotation Markedly limited Markedly limited    Knee flexion 70 85    Knee extension 5 lacking 12 lacking    Ankle dorsiflexion        Ankle plantarflexion        Ankle inversion        Ankle eversion         (Blank rows = not tested)   LOWER EXTREMITY MMT:   MMT Right eval Left eval Right 10/23/22 Left 10/23/22  Hip flexion 2 3 2+ 3+  Hip extension 2 2+    Hip abduction 2 3    Hip adduction        Hip internal rotation        Hip external rotation        Knee flexion 5 5    Knee extension 5 5    Ankle dorsiflexion        Ankle plantarflexion        Ankle inversion        Ankle eversion         (Blank rows = not tested)   LOWER EXTREMITY SPECIAL TESTS:  NT   FUNCTIONAL TESTS:  Timed up and go (TUG): 44" 2 minute walk test: 176 feet 10/02/22; 10/23/22: 108 feet  11/02/22 123f c rollator Sit to stand is labored and requires use of arms   GAIT: Distance walked: 1566fAssistive device utilized: WaEnvironmental consultant 4 wheeled Level of assistance: Modified independence Comments: decreased pace, postural deformities       TODAY'S TREATMENT: [facility] Adult PT Treatment:                                                 DATE: 11/02/22 Therapeutic Exercise: Nustep L2 UE/LE x 5 minutes  LAQ 3# 3x10 each  Partial squats at FM x10  Standing hip abd 2x10 3# at FM,  Standing hip marching 2x10 3# at [facility]:                                                DATE: 10/23/22 Therapeutic Exercise: Nustep L2 UE/LE x 5 minutes  2 MWT 108 Feet  LAQ 3# each  Hip Flexion Alternating, 3# on Lt Supine heel slide Right and left - improved ability for active Right heel slides Passive knee to chest  Passive hip abduction    PATIENT EDUCATION:  Education details: Eval findings, POC, HEP Person educated: Patient and Caregiver sister Education method: Explanation, Demonstration, Tactile cues, and Verbal cues Education comprehension: verbalized understanding, returned demonstration, verbal cues required, tactile cues required, and  needs further education     HOME EXERCISE PROGRAM: Access Code: [email or web address] URL: [email or web address] Date: 09/20/2022 Prepared by: Gar Ponto   Exercises - Sit to Stand with Counter Support  - 2-3 x daily - 7 x weekly - 1 sets - 10 reps - 3 hold - Seated Hip Abduction with Resistance  - 2-3 x daily - 7 x weekly - 1 sets - 10 reps - 3 hold - Seated Knee Lifts with Resistance  - 2-3 x daily - 7 x weekly - 1 sets - 10 reps - 3 hold Added 10/02/22 - Bridge 2 x per day, 7 x per week 2 sets- 10 reps   ASSESSMENT:   CLINICAL IMPRESSION: Pt was in better spirits today and was encouraged that her R leg is moving "some better". PT was completed today for LE/trunk strengthening and AROM. ROMs for B hips and knees continue to be limited with hard end feels indicating low likely hood for increasing. Pt's 2MWT was was reassessed again today and was improved this the eval indicating improved ability to ambulate. Pt tolerated PT today without adverse effects. Pt will continue to benefit from skilled PT to  address impairments for improved functional mobility.  OBJECTIVE IMPAIRMENTS Abnormal gait, decreased activity tolerance, decreased endurance, difficulty walking, decreased ROM, decreased strength, impaired flexibility, and postural dysfunction.    ACTIVITY LIMITATIONS carrying, lifting, bending, sitting, standing, squatting, stairs, transfers, bed mobility, bathing, toileting, dressing, hygiene/grooming, and locomotion level   PARTICIPATION LIMITATIONS: meal prep, cleaning, laundry, shopping, and community activity   PERSONAL FACTORS Age, Fitness, Past/current experiences, Time since onset of injury/illness/exacerbation, and 3+ comorbidities:    OA, osteoporosis, cataracts are also affecting patient's functional outcome.    REHAB POTENTIAL: Good   CLINICAL DECISION MAKING: Stable/uncomplicated   EVALUATION COMPLEXITY: Low     GOALS:   SHORT TERM GOALS: Target date: 10/12/2022  Pt will be Ind in an initial HEP Baseline: initiated Status: 10/23/22: verbally reports compliance  Goal status: PARTIALLY MET   LONG TERM GOALS: Target date: 11/09/22    Pt will be Ind in a final HEP to maintain achieved LOF Baseline: initiated Goal status: INITIAL   2.  Improve TUG by 10" as indication of improved functional mobility  Baseline:  Goal status: Deferred- not a good measure for assessing pt's function   3.  Improve 2MWT by MCID of 27f as indication of improved functional mobility and community ambulation Baseline: 176 feet Status: 10/23/22 : 108 feet 11/02/22: 192 ft (2244fis the goal) Goal status: Improved   4.  Increase LE strength to the R to 3/5 and the L to 3+/5 for improved safety with functional mobility Baseline: see flow sheet Status: 10/23/22: Left hip flexion 3+/5 Goal status: ONGOING   5.  Pt's FOTO score will improved to the predicted value of 45% as indication of improved function  Baseline: 28% Goal status: INITIAL   PLAN: PT FREQUENCY: 2x/week   PT DURATION: 6  weeks   PLANNED INTERVENTIONS: Therapeutic exercises, Therapeutic activity, Balance training, Gait training, Patient/Family education, Self Care, Joint mobilization, Stair training, Aquatic Therapy, Dry Needling, Cryotherapy, Moist heat, Taping, Ultrasound, Ionotophoresis 102m21ml Dexamethasone, Manual therapy, and Re-evaluation   PLAN FOR NEXT SESSION: FOTO, passive Hip ROM, Progress therex as indicated; use of modalities, manual therapy; and TPDN as indicated.  Assess pt's ability to complete HEP. Reassess FOTO.  Katara Griner MS, PT 11/02/22 10:39 AM

## 2022-11-08 ENCOUNTER — Encounter: Payer: Self-pay | Admitting: Physical Therapy

## 2022-11-08 ENCOUNTER — Ambulatory Visit: Payer: Medicare HMO

## 2022-11-08 DIAGNOSIS — R262 Difficulty in walking, not elsewhere classified: Secondary | ICD-10-CM | POA: Diagnosis not present

## 2022-11-08 DIAGNOSIS — M25652 Stiffness of left hip, not elsewhere classified: Secondary | ICD-10-CM | POA: Diagnosis not present

## 2022-11-08 DIAGNOSIS — M6281 Muscle weakness (generalized): Secondary | ICD-10-CM

## 2022-11-08 DIAGNOSIS — M25662 Stiffness of left knee, not elsewhere classified: Secondary | ICD-10-CM | POA: Diagnosis not present

## 2022-11-08 DIAGNOSIS — M25651 Stiffness of right hip, not elsewhere classified: Secondary | ICD-10-CM

## 2022-11-08 DIAGNOSIS — M25661 Stiffness of right knee, not elsewhere classified: Secondary | ICD-10-CM

## 2022-11-08 NOTE — Therapy (Signed)
OUTPATIENT PHYSICAL THERAPY TREATMENT NOTE/Re-Eval/Re-Cert   Patient Name: Jenna Kelley MRN: [id number] DOB:[birth year], 71 y.o., female Today's Date: 11/08/2022  PCP: Fanny Bien, MD   REFERRING PROVIDER: Fanny Bien, MD    END OF SESSION:   PT End of Session - 11/08/22 0934     Visit Number 7    Number of Visits 13    Date for PT Re-Evaluation 12/29/22    Authorization Type [employer]    Progress Note Due on Visit 10    PT Start Time 0930    PT Stop Time 1015    PT Time Calculation (min) 45 min    Equipment Utilized During Treatment Other (comment)   rollator   Activity Tolerance Patient tolerated treatment well    Behavior During Therapy WFL for tasks assessed/performed               Past Medical History:  Diagnosis Date   Cataract    DVT (deep venous thrombosis) ([place])    left leg   Osteoarthritis    shoulder   Umbilical hernia    Past Surgical History:  Procedure Laterality Date   NO PAST SURGERIES     Patient Active Problem List   Diagnosis Date Noted   Acute deep vein thrombosis (DVT) of calf muscle vein of left lower extremity ([place]) 10/06/2019   DVT (deep venous thrombosis) ([place]) 10/05/2019   Hypokalemia 10/05/2019    REFERRING DIAG: physical deconditioning   THERAPY DIAG:  Difficulty in walking, not elsewhere classified  Muscle weakness (generalized)  Stiffness of left hip, not elsewhere classified  Stiffness of right hip, not elsewhere classified  Stiffness of left knee, not elsewhere classified  Stiffness of right knee, not elsewhere classified  Rationale for Evaluation and Treatment Rehabilitation  PERTINENT HISTORY: OA, osteoporosis, cataracts   PRECAUTIONS: Fall   SUBJECTIVE:                                                                                                                                                                                      SUBJECTIVE STATEMENT:  Pt reports she is  moving better and would like to continue PT. Pt denies pain.  PAIN: . Are you having pain? No   OBJECTIVE: (objective measures completed at initial evaluation unless otherwise dated)   DIAGNOSTIC FINDINGS: No imaging for the knees or hips   PATIENT SURVEYS:  FOTO: Perceived function   28%, predicted   45% 11/08/22=59%   COGNITION:           Overall cognitive status: Within functional limits for tasks assessed  SENSATION: WFL   EDEMA:  None   POSTURE: rounded shoulders, forward head, and flexed trunk , knee valgus, hip IR (Knocked kneed and        Pigeon toed)   PALPATION: NT   LOWER EXTREMITY ROM:   Passive ROM Right eval Left eval Right 10/23/22 Left 10/23/22  Hip flexion 60 80 65 95  Hip extension        Hip abduction     0 0  Hip adduction        Hip internal rotation Markedly limited Markedly limited    Hip external rotation Markedly limited Markedly limited    Knee flexion 70 85    Knee extension 5 lacking 12 lacking    Ankle dorsiflexion        Ankle plantarflexion        Ankle inversion        Ankle eversion         (Blank rows = not tested)   LOWER EXTREMITY MMT:   MMT Right eval Left eval Right 10/23/22 Left 10/23/22 Rt 11/08/22 Lt 11/08/22  Hip flexion 2 3 2+ 3+ 3 4-   Hip extension 2 2+   3+ 3+  Hip abduction 2 3   3+ 3+  Hip adduction          Hip internal rotation          Hip external rotation          Knee flexion 5 5      Knee extension 5 5      Ankle dorsiflexion          Ankle plantarflexion          Ankle inversion          Ankle eversion           (Blank rows = not tested)   LOWER EXTREMITY SPECIAL TESTS:  NT   FUNCTIONAL TESTS:  Timed up and go (TUG): 44" 2 minute walk test: 176 feet 10/02/22; 10/23/22: 108 feet  11/02/22 131f c rollator Sit to stand is labored and requires use of arms   GAIT: Distance walked: 1525fAssistive device utilized: WaEnvironmental consultant 4 wheeled Level of assistance: Modified  independence Comments: decreased pace, postural deformities       TODAY'S TREATMENT: [facility] Adult PT Treatment:                                                DATE: 11/08/22 Therapeutic Exercise: LAQ 3# 3x10 each  Partial squats at FM x10  Standing hip abd 2x10 3# at FM,  Standing hip marching 2x10 3# at FM Heel lifts [facility]x10 [facility] Therapeutic Activities: FOTO- Completion and review   [facility] Adult PT Treatment:                                                DATE: 11/02/22 Therapeutic Exercise: Nustep L2 UE/LE x 5 minutes  LAQ 3# 3x10 each  Partial squats at FM x10  Standing hip abd 2x10 3# at FM,  Standing hip marching 2x10 3# at FM Heel lifts [facility]x10 [facility]:  DATE: 10/23/22 Therapeutic Exercise: Nustep L2 UE/LE x 5 minutes  2 MWT 108 Feet  LAQ 3# each  Hip Flexion Alternating, 3# on Lt Supine heel slide Right and left - improved ability for active Right heel slides Passive knee to chest  Passive hip abduction    PATIENT EDUCATION:  Education details: Eval findings, POC, HEP Person educated: Patient and Caregiver sister Education method: Explanation, Demonstration, Tactile cues, and Verbal cues Education comprehension: verbalized understanding, returned demonstration, verbal cues required, tactile cues required, and needs further education     HOME EXERCISE PROGRAM: Access Code: [email or web address] URL: [email or web address] Date: 09/20/2022 Prepared by: Gar Ponto   Exercises - Sit to Stand with Counter Support  - 2-3 x daily - 7 x weekly - 1 sets - 10 reps - 3 hold - Seated Hip Abduction with Resistance  - 2-3 x daily - 7 x weekly - 1 sets - 10 reps - 3 hold - Seated Knee Lifts with Resistance  - 2-3 x daily - 7 x weekly - 1 sets - 10 reps - 3 hold Added 10/02/22 - Bridge 2 x per day, 7 x per week 2 sets- 10 reps   ASSESSMENT:   CLINICAL IMPRESSION: Completed reassessment today. Pt has made  good improvement with her leg strength and ambulation. With the 2MWT, she was able to walk a farther distance within that time frame. Additionally, pt's FOTO score has significantly improved indicating the pt perceives that her functional ability is improving. Besides the re-eval, PT was completed for therex to improve LE/trunk strength and function. Pt has participated in 7 of the 13 approved visits within the approved time. Recommend the continuation of PT 1w6 to further address impairments for improved functional mobility.  OBJECTIVE IMPAIRMENTS Abnormal gait, decreased activity tolerance, decreased endurance, difficulty walking, decreased ROM, decreased strength, impaired flexibility, and postural dysfunction.    ACTIVITY LIMITATIONS carrying, lifting, bending, sitting, standing, squatting, stairs, transfers, bed mobility, bathing, toileting, dressing, hygiene/grooming, and locomotion level   PARTICIPATION LIMITATIONS: meal prep, cleaning, laundry, shopping, and community activity   PERSONAL FACTORS Age, Fitness, Past/current experiences, Time since onset of injury/illness/exacerbation, and 3+ comorbidities:    OA, osteoporosis, cataracts are also affecting patient's functional outcome.    REHAB POTENTIAL: Good   CLINICAL DECISION MAKING: Stable/uncomplicated   EVALUATION COMPLEXITY: Low     GOALS:   SHORT TERM GOALS: Target date: 10/12/2022  Pt will be Ind in an initial HEP Baseline: initiated Status: 10/23/22: verbally reports compliance  Goal status: PARTIALLY MET   LONG TERM GOALS: Target date: 12/29/22   Pt will be Ind in a final HEP to maintain achieved LOF Baseline: initiated Goal status: Ongoing   2.  Improve TUG by 10" as indication of improved functional mobility  Baseline:  Goal status: Deferred- not a good measure for assessing pt's function   3.  Improve 2MWT by MCID of 59f as indication of improved functional mobility and community ambulation Baseline: 176  feet Status: 10/23/22 : 108 feet 11/02/22: 192 ft (2246fis the goal) Goal status: Improved   4.  Increase LE strength to the R to 3/5 and the L to 3+/5 for improved safety with functional mobility Baseline: see flow sheet Status: 10/23/22: Left hip flexion 3+/5 11/08/22= Goal status: ONGOING   5.  Pt's FOTO score will improved to the predicted value of 45% as indication of improved function  Baseline: 28% Status: 59% Goal status: MET   PLAN: PT FREQUENCY: 1x/week   PT DURATION: 6  weeks   PLANNED INTERVENTIONS: Therapeutic exercises, Therapeutic activity, Balance training, Gait training, Patient/Family education, Self Care, Joint mobilization, Stair training, Aquatic Therapy, Dry Needling, Cryotherapy, Moist heat, Taping, Ultrasound, Ionotophoresis 22m/ml Dexamethasone, Manual therapy, and Re-evaluation   PLAN FOR NEXT SESSION: FOTO, passive Hip ROM, Progress therex as indicated; use of modalities, manual therapy; and TPDN as indicated.   Sheyanne Munley MS, PT 11/08/22 1:58 PM   Referring diagnosis? physical deconditioning  Treatment diagnosis? (if different than referring diagnosis) Difficulty in walking, not elsewhere classified, Muscle weakness (generalized), Stiffness of left hip, not elsewhere classified, Stiffness of right hip, not elsewhere classified, Stiffness of left knee, not elsewhere classified, Stiffness of right knee, not elsewhere classified  What was this (referring dx) caused by? _0  Surgery _1  Fall _2  Ongoing issue _3  Arthritis _4  Other: ____________  Laterality: _5  Rt _6  Lt _7  Both  Check all possible CPT codes:  *CHOOSE 10 OR LESS*    _8  97110 (Therapeutic Exercise)  _9  92507 (SLP Treatment)  _10  910272(Neuro Re-ed)   _11  92526 (Swallowing Treatment)   _12  953664(Gait Training)   _13  940347(Cognitive Training, 1st 15 minutes) _14  97140 (Manual Therapy)   _15  97130 (Cognitive Training, each add'l 15 minutes)  _16  97164 (Re-evaluation)                               _17  Other, List CPT Code ____________  _18  942595(Therapeutic Activities)     _19  963875(Self Care)   _20  All codes above (97110 - 97535)  _21  97012 (Mechanical Traction)  _22  97014 (E-stim Unattended)  _23  97032 (E-stim manual)  _24  97033 (Ionto)  _25  97035 (Ultrasound) _26  97750 (Physical Performance Training) _27  964332(Aquatic Therapy) _28  97016 (Vasopneumatic Device) _29  995188(Paraffin) _30  941660(Contrast Bath) _31  97597 (Wound Care 1st 20 sq cm) _32  97598 (Wound Care each add'l 20 sq cm) _33  97760 (Orthotic Fabrication, Fitting, Training Initial) _34  [id number](Prosthetic Management and Training Initial) _35  [id number](Orthotic or Prosthetic Training/ Modification Subsequent)

## 2022-11-16 ENCOUNTER — Ambulatory Visit: Payer: Medicare HMO | Admitting: Physical Therapy

## 2022-11-16 ENCOUNTER — Encounter: Payer: Self-pay | Admitting: Physical Therapy

## 2022-11-16 DIAGNOSIS — M6281 Muscle weakness (generalized): Secondary | ICD-10-CM

## 2022-11-16 DIAGNOSIS — R262 Difficulty in walking, not elsewhere classified: Secondary | ICD-10-CM | POA: Diagnosis not present

## 2022-11-16 DIAGNOSIS — M25651 Stiffness of right hip, not elsewhere classified: Secondary | ICD-10-CM | POA: Diagnosis not present

## 2022-11-16 DIAGNOSIS — M25652 Stiffness of left hip, not elsewhere classified: Secondary | ICD-10-CM

## 2022-11-16 DIAGNOSIS — M25662 Stiffness of left knee, not elsewhere classified: Secondary | ICD-10-CM | POA: Diagnosis not present

## 2022-11-16 DIAGNOSIS — M25661 Stiffness of right knee, not elsewhere classified: Secondary | ICD-10-CM | POA: Diagnosis not present

## 2022-11-16 NOTE — Therapy (Signed)
OUTPATIENT PHYSICAL THERAPY TREATMENT NOTE/Re-Eval/Re-Cert   Patient Name: Jenna Kelley MRN: [id number] DOB:01/14/1951, 71 y.o., female Today's Date: 11/16/2022  PCP: Dewey, Elizabeth R, MD   REFERRING PROVIDER: Dewey, Elizabeth R, MD    END OF SESSION:   PT End of Session - 11/16/22 0814     Visit Number 8    Number of Visits 13    Date for PT Re-Evaluation 12/29/22    Authorization Type [employer]    Authorization Time Period 11/13/22-12/29/21;    Progress Note Due on Visit 10    PT Start Time 0800    PT Stop Time 0845    PT Time Calculation (min) 45 min               Past Medical History:  Diagnosis Date   Cataract    DVT (deep venous thrombosis) ([facility])    left leg   Osteoarthritis    shoulder   Umbilical hernia    Past Surgical History:  Procedure Laterality Date   NO PAST SURGERIES     Patient Active Problem List   Diagnosis Date Noted   Acute deep vein thrombosis (DVT) of calf muscle vein of left lower extremity ([facility]) 10/06/2019   DVT (deep venous thrombosis) ([facility]) 10/05/2019   Hypokalemia 10/05/2019    REFERRING DIAG: physical deconditioning   THERAPY DIAG:  Difficulty in walking, not elsewhere classified  Muscle weakness (generalized)  Stiffness of left hip, not elsewhere classified  Rationale for Evaluation and Treatment Rehabilitation  PERTINENT HISTORY: OA, osteoporosis, cataracts   PRECAUTIONS: Fall   SUBJECTIVE:                                                                                                                                                                                      SUBJECTIVE STATEMENT:  Pt reports she is moving better and would like to continue PT. Pt denies pain.  PAIN: . Are you having pain? No   OBJECTIVE: (objective measures completed at initial evaluation unless otherwise dated)   DIAGNOSTIC FINDINGS: No imaging for the knees or hips   PATIENT SURVEYS:  FOTO: Perceived  function   28%, predicted   45%  11/08/22=59%   COGNITION:           Overall cognitive status: Within functional limits for tasks assessed                          SENSATION: WFL   EDEMA:  None   POSTURE: rounded shoulders, forward head, and flexed trunk , knee valgus, hip IR (Knocked kneed and        Pigeon   toed)   PALPATION: NT   LOWER EXTREMITY ROM:   Passive ROM Right eval Left eval Right 10/23/22 Left 10/23/22 Right 11/16/22  Hip flexion 60 80 65 95 65  Hip extension         Hip abduction     0 0   Hip adduction         Hip internal rotation Markedly limited Markedly limited     Hip external rotation Markedly limited Markedly limited     Knee flexion 70 85     Knee extension 5 lacking 12 lacking     Ankle dorsiflexion         Ankle plantarflexion         Ankle inversion         Ankle eversion          (Blank rows = not tested)   LOWER EXTREMITY MMT:   MMT Right eval Left eval Right 10/23/22 Left 10/23/22 Rt 11/08/22 Lt 11/08/22  Hip flexion 2 3 2+ 3+ 3 4-   Hip extension 2 2+   3+ 3+  Hip abduction 2 3   3+ 3+  Hip adduction          Hip internal rotation          Hip external rotation          Knee flexion 5 5      Knee extension 5 5      Ankle dorsiflexion          Ankle plantarflexion          Ankle inversion          Ankle eversion           (Blank rows = not tested)   LOWER EXTREMITY SPECIAL TESTS:  NT   FUNCTIONAL TESTS:  Timed up and go (TUG): 44" 2 minute walk test: 176 feet 10/02/22; 10/23/22: 108 feet  11/02/22 129f c rollator Sit to stand is labored and requires use of arms   GAIT: Distance walked: 1532fAssistive device utilized: WaEnvironmental consultant 4 wheeled Level of assistance: Modified independence Comments: decreased pace, postural deformities       TODAY'S TREATMENT: [facility] Adult PT Treatment:                                                DATE: 11/16/22 Therapeutic Exercise: Seated LAQ 4# x 30 each Seated March 4# x 20 each   Standing march Standing hip abduction Standing runners stretch at rollator x 10 each , 5 sec  Supine Bridge - ball btw knees for alignment 10 x 2  LTR with Ball between knees.  Supine knee to chest with sheet pull x 5 each  Seated lumbar stretch- pushing Rollator away and back- attempting nose over toes position STS high mat, hands on rollator to emphasize nose over toes    [facility] Adult PT Treatment:                                                DATE: 11/08/22 Therapeutic Exercise: LAQ 3# 3x10 each  Partial squats at FM x10  Standing hip abd 2x10 3# at FM,  Standing hip marching 2x10 3# at [facility]  lifts 2x10 at [facility] MMT Therapeutic Activities: FOTO- Completion and review   [place] Adult PT Treatment:                                                DATE: 11/02/22 Therapeutic Exercise: Nustep L2 UE/LE x 5 minutes  LAQ 3# 3x10 each  Partial squats at FM x10  Standing hip abd 2x10 3# at FM,  Standing hip marching 2x10 3# at [facility] Heel lifts 2x10 at [place] Adult PT Treatment:                                                DATE: 10/23/22 Therapeutic Exercise: Nustep L2 UE/LE x 5 minutes  2 MWT 108 Feet  LAQ 3# each  Hip Flexion Alternating, 3# on Lt Supine heel slide Right and left - improved ability for active Right heel slides Passive knee to chest  Passive hip abduction    PATIENT EDUCATION:  Education details: Eval findings, POC, HEP Person educated: Patient and Caregiver sister Education method: Explanation, Demonstration, Tactile cues, and Verbal cues Education comprehension: verbalized understanding, returned demonstration, verbal cues required, tactile cues required, and needs further education     HOME EXERCISE PROGRAM: Access Code: [email or web address] URL: [email or web address] Date: 09/20/2022 Prepared by: Gar Ponto   Exercises - Sit to Stand with Counter Support  - 2-3 x daily - 7 x weekly - 1 sets - 10 reps - 3 hold - Seated Hip Abduction with Resistance  -  2-3 x daily - 7 x weekly - 1 sets - 10 reps - 3 hold - Seated Knee Lifts with Resistance  - 2-3 x daily - 7 x weekly - 1 sets - 10 reps - 3 hold Added 10/02/22 - Bridge 2 x per day, 7 x per week 2 sets- 10 reps   ASSESSMENT:   CLINICAL IMPRESSION: Pt sits in Rolator in lobby due to inability to STS in a standard chair. She requires increased time to rise from [place] and move it in front of her for ambulation. Continued with LE strength and stretching. Continue to reinforce "nose over toes"  cue to reduce the compensations for STS. Worked on seated and supine lumbar stretching /hip stretching.  Her right hip flexion continues limit her ability to hinge at hips for safe STS transfers. She requires several attempts to independently bring RLE onto table during sit-supine transfer.  She will benefit from continuation of PT to further address impairments for improved functional mobility.  OBJECTIVE IMPAIRMENTS Abnormal gait, decreased activity tolerance, decreased endurance, difficulty walking, decreased ROM, decreased strength, impaired flexibility, and postural dysfunction.    ACTIVITY LIMITATIONS carrying, lifting, bending, sitting, standing, squatting, stairs, transfers, bed mobility, bathing, toileting, dressing, hygiene/grooming, and locomotion level   PARTICIPATION LIMITATIONS: meal prep, cleaning, laundry, shopping, and community activity   PERSONAL FACTORS Age, Fitness, Past/current experiences, Time since onset of injury/illness/exacerbation, and 3+ comorbidities:    OA, osteoporosis, cataracts are also affecting patient's functional outcome.    REHAB POTENTIAL: Good   CLINICAL DECISION MAKING: Stable/uncomplicated   EVALUATION COMPLEXITY: Low     GOALS:   SHORT TERM GOALS: Target date: 10/12/2022  Pt will be Ind in an initial HEP Baseline: initiated Status: 10/23/22:  verbally reports compliance  Goal status: PARTIALLY MET   LONG TERM GOALS: Target date: 12/29/22   Pt will be  Ind in a final HEP to maintain achieved LOF Baseline: initiated Goal status: Ongoing   2.  Improve TUG by 10" as indication of improved functional mobility  Baseline:  Goal status: Deferred- not a good measure for assessing pt's function   3.  Improve 2MWT by MCID of 50f as indication of improved functional mobility and community ambulation Baseline: 176 feet Status: 10/23/22 : 108 feet 11/02/22: 192 ft (2276fis the goal) Goal status: Improved   4.  Increase LE strength to the R to 3/5 and the L to 3+/5 for improved safety with functional mobility Baseline: see flow sheet Status: 10/23/22: Left hip flexion 3+/5 11/08/22= Goal status: ONGOING   5.  Pt's FOTO score will improved to the predicted value of 45% as indication of improved function  Baseline: 28% Status: 59% Goal status: MET   PLAN: PT FREQUENCY: 1x/week   PT DURATION: 6 weeks   PLANNED INTERVENTIONS: Therapeutic exercises, Therapeutic activity, Balance training, Gait training, Patient/Family education, Self Care, Joint mobilization, Stair training, Aquatic Therapy, Dry Needling, Cryotherapy, Moist heat, Taping, Ultrasound, Ionotophoresis 14m19ml Dexamethasone, Manual therapy, and Re-evaluation   PLAN FOR NEXT SESSION:  passive Hip ROM, functional strengthening, Progress therex as indicated; use of modalities, manual therapy; and TPDN as indicated.   JesHessie DienerTA 11/16/22 10:52 AM Phone: [phone]: [phone]

## 2022-11-20 NOTE — Therapy (Signed)
OUTPATIENT PHYSICAL THERAPY TREATMENT NOTE/Re-Eval/Re-Cert   Patient Name: Jenna Kelley MRN: [id number] DOB:07/31/1951, 71 y.o., female Today's Date: 11/16/2022  PCP: Dewey, Elizabeth R, MD   REFERRING PROVIDER: Dewey, Elizabeth R, MD    END OF SESSION:   PT End of Session - 11/16/22 0814     Visit Number 8    Number of Visits 13    Date for PT Re-Evaluation 12/29/22    Authorization Type [employer]    Authorization Time Period 11/13/22-12/29/21;    Progress Note Due on Visit 10    PT Start Time 0800    PT Stop Time 0845    PT Time Calculation (min) 45 min               Past Medical History:  Diagnosis Date   Cataract    DVT (deep venous thrombosis) (HCC)    left leg   Osteoarthritis    shoulder   Umbilical hernia    Past Surgical History:  Procedure Laterality Date   NO PAST SURGERIES     Patient Active Problem List   Diagnosis Date Noted   Acute deep vein thrombosis (DVT) of calf muscle vein of left lower extremity (HCC) 10/06/2019   DVT (deep venous thrombosis) (HCC) 10/05/2019   Hypokalemia 10/05/2019    REFERRING DIAG: physical deconditioning   THERAPY DIAG:  Difficulty in walking, not elsewhere classified  Muscle weakness (generalized)  Stiffness of left hip, not elsewhere classified  Rationale for Evaluation and Treatment Rehabilitation  PERTINENT HISTORY: OA, osteoporosis, cataracts   PRECAUTIONS: Fall   SUBJECTIVE:                                                                                                                                                                                      SUBJECTIVE STATEMENT:  Pt reports she is moving better and would like to continue PT. Pt denies pain.  PAIN: . Are you having pain? No   OBJECTIVE: (objective measures completed at initial evaluation unless otherwise dated)   DIAGNOSTIC FINDINGS: No imaging for the knees or hips   PATIENT SURVEYS:  FOTO: Perceived  function   28%, predicted   45%  11/08/22=59%   COGNITION:           Overall cognitive status: Within functional limits for tasks assessed                          SENSATION: WFL   EDEMA:  None   POSTURE: rounded shoulders, forward head, and flexed trunk , knee valgus, hip IR (Knocked kneed and        Pigeon   toed)   PALPATION: NT   LOWER EXTREMITY ROM:   Passive ROM Right eval Left eval Right 10/23/22 Left 10/23/22 Right 11/16/22  Hip flexion 60 80 65 95 65  Hip extension         Hip abduction     0 0   Hip adduction         Hip internal rotation Markedly limited Markedly limited     Hip external rotation Markedly limited Markedly limited     Knee flexion 70 85     Knee extension 5 lacking 12 lacking     Ankle dorsiflexion         Ankle plantarflexion         Ankle inversion         Ankle eversion          (Blank rows = not tested)   LOWER EXTREMITY MMT:   MMT Right eval Left eval Right 10/23/22 Left 10/23/22 Rt 11/08/22 Lt 11/08/22  Hip flexion 2 3 2+ 3+ 3 4-   Hip extension 2 2+   3+ 3+  Hip abduction 2 3   3+ 3+  Hip adduction          Hip internal rotation          Hip external rotation          Knee flexion 5 5      Knee extension 5 5      Ankle dorsiflexion          Ankle plantarflexion          Ankle inversion          Ankle eversion           (Blank rows = not tested)   LOWER EXTREMITY SPECIAL TESTS:  NT   FUNCTIONAL TESTS:  Timed up and go (TUG): 44" 2 minute walk test: 176 feet 10/02/22; 10/23/22: 108 feet  11/02/22 131f c rollator Sit to stand is labored and requires use of arms   GAIT: Distance walked: 1522fAssistive device utilized: WaEnvironmental consultant 4 wheeled Level of assistance: Modified independence Comments: decreased pace, postural deformities       TODAY'S TREATMENT: [facility] Adult PT Treatment:                                                DATE: 11/21/22 Therapeutic Exercise: *** Manual Therapy: *** Neuromuscular  re-ed: *** Therapeutic Activity: *** Modalities: *** Self Care: ***  [facility] Adult PT Treatment:                                                DATE: 11/16/22 Therapeutic Exercise: Seated LAQ 4# x 30 each Seated March 4# x 20 each  Standing march Standing hip abduction Standing runners stretch at rollator x 10 each , 5 sec  Supine Bridge - ball btw knees for alignment 10 x 2  LTR with Ball between knees.  Supine knee to chest with sheet pull x 5 each  Seated lumbar stretch- pushing Rollator away and back- attempting nose over toes position STS high mat, hands on rollator to emphasize nose over toes    [facility]  [facility] Therapeutic Exercise: LAQ 3# 3x10 each  Partial squats at FM x10  Standing hip abd 2x10 3# at FM,  Standing hip marching 2x10 3# at FM Heel lifts 2x10 at [facility] MMT Therapeutic Activities: FOTO- Completion and review   PATIENT EDUCATION:  Education details: Eval findings, POC, HEP Person educated: Patient and Caregiver sister Education method: Explanation, Demonstration, Tactile cues, and Verbal cues Education comprehension: verbalized understanding, returned demonstration, verbal cues required, tactile cues required, and needs further education     HOME EXERCISE PROGRAM: Access Code: [email or web address] URL: [email or web address] Date: 09/20/2022 Prepared by: Gar Ponto   Exercises - Sit to Stand with Counter Support  - 2-3 x daily - 7 x weekly - 1 sets - 10 reps - 3 hold - Seated Hip Abduction with Resistance  - 2-3 x daily - 7 x weekly - 1 sets - 10 reps - 3 hold - Seated Knee Lifts with Resistance  - 2-3 x daily - 7 x weekly - 1 sets - 10 reps - 3 hold Added 10/02/22 - Bridge 2 x per day, 7 x per week 2 sets- 10 reps   ASSESSMENT:   CLINICAL IMPRESSION: Pt sits in Rolator in lobby due to inability to STS in a standard chair. She requires increased time to rise from [place] and  move it in front of her for ambulation. Continued with LE strength and stretching. Continue to reinforce "nose over toes"  cue to reduce the compensations for STS. Worked on seated and supine lumbar stretching /hip stretching.  Her right hip flexion continues limit her ability to hinge at hips for safe STS transfers. She requires several attempts to independently bring RLE onto table during sit-supine transfer.  She will benefit from continuation of PT to further address impairments for improved functional mobility.  OBJECTIVE IMPAIRMENTS Abnormal gait, decreased activity tolerance, decreased endurance, difficulty walking, decreased ROM, decreased strength, impaired flexibility, and postural dysfunction.    ACTIVITY LIMITATIONS carrying, lifting, bending, sitting, standing, squatting, stairs, transfers, bed mobility, bathing, toileting, dressing, hygiene/grooming, and locomotion level   PARTICIPATION LIMITATIONS: meal prep, cleaning, laundry, shopping, and community activity   PERSONAL FACTORS Age, Fitness, Past/current experiences, Time since onset of injury/illness/exacerbation, and 3+ comorbidities:    OA, osteoporosis, cataracts are also affecting patient's functional outcome.    REHAB POTENTIAL: Good   CLINICAL DECISION MAKING: Stable/uncomplicated   EVALUATION COMPLEXITY: Low     GOALS:   SHORT TERM GOALS: Target date: 10/12/2022  Pt will be Ind in an initial HEP Baseline: initiated Status: 10/23/22: verbally reports compliance  Goal status: PARTIALLY MET   LONG TERM GOALS: Target date: 12/29/22   Pt will be Ind in a final HEP to maintain achieved LOF Baseline: initiated Goal status: Ongoing   2.  Improve TUG by 10" as indication of improved functional mobility  Baseline:  Goal status: Deferred- not a good measure for assessing pt's function   3.  Improve 2MWT by MCID of 76f as indication of improved functional mobility and community ambulation Baseline: 176 feet Status:  10/23/22 : 108 feet 11/02/22: 192 ft (2243fis the goal) Goal status: Improved   4.  Increase LE strength to the R to 3/5 and the L to 3+/5 for improved safety with functional mobility Baseline: see flow sheet Status: 10/23/22: Left hip flexion 3+/5 11/08/22= Goal status: ONGOING   5.  Pt's FOTO score will improved to the predicted value of 45% as indication of improved function  Baseline: 28% Status: 59% Goal status:  MET   PLAN: PT FREQUENCY: 1x/week   PT DURATION: 6 weeks   PLANNED INTERVENTIONS: Therapeutic exercises, Therapeutic activity, Balance training, Gait training, Patient/Family education, Self Care, Joint mobilization, Stair training, Aquatic Therapy, Dry Needling, Cryotherapy, Moist heat, Taping, Ultrasound, Ionotophoresis 20m/ml Dexamethasone, Manual therapy, and Re-evaluation   PLAN FOR NEXT SESSION:  passive Hip ROM, functional strengthening, Progress therex as indicated; use of modalities, manual therapy; and TPDN as indicated.   Hercules Hasler MS, PT 11/20/22 9:02 PM

## 2022-11-21 ENCOUNTER — Ambulatory Visit: Payer: Medicare HMO

## 2022-11-21 DIAGNOSIS — M25661 Stiffness of right knee, not elsewhere classified: Secondary | ICD-10-CM

## 2022-11-21 DIAGNOSIS — M25652 Stiffness of left hip, not elsewhere classified: Secondary | ICD-10-CM | POA: Diagnosis not present

## 2022-11-21 DIAGNOSIS — M6281 Muscle weakness (generalized): Secondary | ICD-10-CM | POA: Diagnosis not present

## 2022-11-21 DIAGNOSIS — M25651 Stiffness of right hip, not elsewhere classified: Secondary | ICD-10-CM

## 2022-11-21 DIAGNOSIS — R262 Difficulty in walking, not elsewhere classified: Secondary | ICD-10-CM | POA: Diagnosis not present

## 2022-11-21 DIAGNOSIS — M25662 Stiffness of left knee, not elsewhere classified: Secondary | ICD-10-CM | POA: Diagnosis not present

## 2022-12-05 NOTE — Therapy (Signed)
OUTPATIENT PHYSICAL THERAPY TREATMENT NOTE/progress Note   Patient Name: Jenna Kelley MRN: [id number] DOB:July 25, 1951, 72 y.o., female Today's Date: 12/06/2022  PCP: Fanny Bien, MD   REFERRING PROVIDER: Fanny Bien, MD  Progress Note Reporting Period 09/21/23 to 12/06/22  See note below for Objective Data and Assessment of Progress/Goals.       END OF SESSION:   PT End of Session - 12/06/22 0759     Visit Number 10    Number of Visits 13    Date for PT Re-Evaluation 12/29/22    Authorization Type [employer]    Authorization Time Period 11/13/22-12/29/21;    Progress Note Due on Visit 10    PT Start Time 0800    PT Stop Time 0845    PT Time Calculation (min) 45 min    Equipment Utilized During Treatment Other (comment)   rollator   Activity Tolerance Patient tolerated treatment well    Behavior During Therapy WFL for tasks assessed/performed               Past Medical History:  Diagnosis Date   Cataract    DVT (deep venous thrombosis) ([place])    left leg   Osteoarthritis    shoulder   Umbilical hernia    Past Surgical History:  Procedure Laterality Date   NO PAST SURGERIES     Patient Active Problem List   Diagnosis Date Noted   Acute deep vein thrombosis (DVT) of calf muscle vein of left lower extremity ([place]) 10/06/2019   DVT (deep venous thrombosis) ([place]) 10/05/2019   Hypokalemia 10/05/2019    REFERRING DIAG: physical deconditioning   THERAPY DIAG:  Difficulty in walking, not elsewhere classified  Muscle weakness (generalized)  Stiffness of left hip, not elsewhere classified  Stiffness of right hip, not elsewhere classified  Stiffness of left knee, not elsewhere classified  Stiffness of right knee, not elsewhere classified  Rationale for Evaluation and Treatment Rehabilitation  PERTINENT HISTORY: OA, osteoporosis, cataracts   PRECAUTIONS: Fall   SUBJECTIVE:                                                                                                                                                                                       SUBJECTIVE STATEMENT:  Pt reports she is doing well today. She's not experiencing pain.  PAIN: . Are you having pain? No   OBJECTIVE: (objective measures completed at initial evaluation unless otherwise dated)   DIAGNOSTIC FINDINGS: No imaging for the knees or hips   PATIENT SURVEYS:  FOTO: Perceived function   28%, predicted   45%  11/08/22=59%   COGNITION:  Overall cognitive status: Within functional limits for tasks assessed                          SENSATION: WFL   EDEMA:  None   POSTURE: rounded shoulders, forward head, and flexed trunk , knee valgus, hip IR (Knocked kneed and        Pigeon toed)   PALPATION: NT   LOWER EXTREMITY ROM:   Passive ROM Right eval Left eval Right 10/23/22 Left 10/23/22 Right 11/16/22  Hip flexion 60 80 65 95 65  Hip extension         Hip abduction     0 0   Hip adduction         Hip internal rotation Markedly limited Markedly limited     Hip external rotation Markedly limited Markedly limited     Knee flexion 70 85     Knee extension 5 lacking 12 lacking     Ankle dorsiflexion         Ankle plantarflexion         Ankle inversion         Ankle eversion          (Blank rows = not tested)   LOWER EXTREMITY MMT:   MMT Right eval Left eval Right 10/23/22 Left 10/23/22 Rt 11/08/22 Lt 11/08/22  Hip flexion 2 3 2+ 3+ 3 4-   Hip extension 2 2+   3+ 3+  Hip abduction 2 3   3+ 3+  Hip adduction          Hip internal rotation          Hip external rotation          Knee flexion 5 5      Knee extension 5 5      Ankle dorsiflexion          Ankle plantarflexion          Ankle inversion          Ankle eversion           (Blank rows = not tested)   LOWER EXTREMITY SPECIAL TESTS:  NT   FUNCTIONAL TESTS:  Timed up and go (TUG): 44" 2 minute walk test: 176 feet 10/02/22; 10/23/22: 108 feet   11/02/22 172f c rollator Sit to stand is labored and requires use of arms   GAIT: Distance walked: 1569fAssistive device utilized: WaEnvironmental consultant 4 wheeled Level of assistance: Modified independence Comments: decreased pace, postural deformities       TODAY'S TREATMENT: [facility] Adult PT Treatment:                                                DATE: 12/06/21 Therapeutic Exercise: Nustep L4 6 mins UE/LE SLR 2x10 R 4#, L 0# Supine hip abd 2x10 R 4#, L 0# STS for raised bari-mat table x5 c hand A Standing hip abduction x15 each LAQ 2x10 Manual Therapy: AAROM for hip and knee mobility bilat  [facility] Adult PT Treatment:                                                DATE: 11/21/22 Therapeutic Exercise: Seated  LAQ 4# x 30 each Seated March 4# x 20 each  Standing march x15 each Standing hip abduction x15 each Standing runners stretch at rollator x 10 each , 5 sec  Standing ankle PF x20, DF x20 Seated lumbar stretch- pushing Rollator away and back- attempting nose over toes position STS high mat, hands on rollator to emphasize nose over toes  Therapeutic Activity: 2MWT STS x10 STS high mat, hands on rollator to emphasize nose over toes   [facility] Adult PT Treatment:                                                DATE: 11/16/22 Therapeutic Exercise: Seated LAQ 4# x 30 each Seated March 4# x 20 each  Standing march Standing hip abduction Standing runners stretch at rollator x 10 each , 5 sec  Supine Bridge - ball btw knees for alignment 10 x 2  LTR with Ball between knees.  Supine knee to chest with sheet pull x 5 each  Seated lumbar stretch- pushing Rollator away and back- attempting nose over toes position STS high mat, hands on rollator to emphasize nose over toes    PATIENT EDUCATION:  Education details: Eval findings, POC, HEP Person educated: Patient and Caregiver sister Education method: Explanation, Demonstration, Tactile cues, and Verbal cues Education comprehension: verbalized  understanding, returned demonstration, verbal cues required, tactile cues required, and needs further education     HOME EXERCISE PROGRAM: Access Code: [email or web address] URL: [email or web address] Date: 09/20/2022 Prepared by: Gar Ponto   Exercises - Sit to Stand with Counter Support  - 2-3 x daily - 7 x weekly - 1 sets - 10 reps - 3 hold - Seated Hip Abduction with Resistance  - 2-3 x daily - 7 x weekly - 1 sets - 10 reps - 3 hold - Seated Knee Lifts with Resistance  - 2-3 x daily - 7 x weekly - 1 sets - 10 reps - 3 hold Added 10/02/22 - Bridge 2 x per day, 7 x per week 2 sets- 10 reps   ASSESSMENT:   CLINICAL IMPRESSION: PT was completed for LE and trunk ROM and strengthening to improve pt's functional mobility. Over the course of PT, pt's LE strength and function have improved while the ROM for the hips and knees continue to be limited due to arthritic changes. Pt tolerated PT today without adverse effects. Pt will continue to benefit from skilled PT to address impairments for improved function.  OBJECTIVE IMPAIRMENTS Abnormal gait, decreased activity tolerance, decreased endurance, difficulty walking, decreased ROM, decreased strength, impaired flexibility, and postural dysfunction.    ACTIVITY LIMITATIONS carrying, lifting, bending, sitting, standing, squatting, stairs, transfers, bed mobility, bathing, toileting, dressing, hygiene/grooming, and locomotion level   PARTICIPATION LIMITATIONS: meal prep, cleaning, laundry, shopping, and community activity   PERSONAL FACTORS Age, Fitness, Past/current experiences, Time since onset of injury/illness/exacerbation, and 3+ comorbidities:    OA, osteoporosis, cataracts are also affecting patient's functional outcome.    REHAB POTENTIAL: Good   CLINICAL DECISION MAKING: Stable/uncomplicated   EVALUATION COMPLEXITY: Low     GOALS:   SHORT TERM GOALS: Target date: 10/12/2022  Pt will be Ind in an initial HEP Baseline:  initiated Status: 10/23/22: verbally reports compliance  Goal status: PARTIALLY MET   LONG TERM GOALS: Target date: 12/29/22   Pt will be Ind in a final HEP to maintain  achieved LOF Baseline: initiated Goal status: Ongoing   2.  Improve TUG by 10" as indication of improved functional mobility  Baseline:  Goal status: Deferred- not a good measure for assessing pt's function   3.  Improve 2MWT by MCID of 24f as indication of improved functional mobility and community ambulation Baseline: 176 feet Status: 10/23/22 : 108 feet 11/02/22: 192 ft (2274fis the goal) 11/21/22=222" Goal status: Improved- just short of goal   4.  Increase LE strength to the R to 3/5 and the L to 3+/5 for improved safety with functional mobility Baseline: see flow sheet Status: 10/23/22: Left hip flexion 3+/5 11/08/22= see flow sheet Goal status: ONGOING   5.  Pt's FOTO score will improved to the predicted value of 45% as indication of improved function  Baseline: 28% Status: 59% Goal status: MET   PLAN: PT FREQUENCY: 1x/week   PT DURATION: 6 weeks   PLANNED INTERVENTIONS: Therapeutic exercises, Therapeutic activity, Balance training, Gait training, Patient/Family education, Self Care, Joint mobilization, Stair training, Aquatic Therapy, Dry Needling, Cryotherapy, Moist heat, Taping, Ultrasound, Ionotophoresis 48m65ml Dexamethasone, Manual therapy, and Re-evaluation   PLAN FOR NEXT SESSION:  passive Hip ROM, functional strengthening, Progress therex as indicated; use of modalities, manual therapy; and TPDN as indicated. Reassess hip strength  AllGar Ponto, PT 12/06/22 8:59 PM

## 2022-12-06 ENCOUNTER — Ambulatory Visit: Payer: Medicare HMO | Attending: Family Medicine

## 2022-12-06 DIAGNOSIS — M25661 Stiffness of right knee, not elsewhere classified: Secondary | ICD-10-CM | POA: Insufficient documentation

## 2022-12-06 DIAGNOSIS — M25662 Stiffness of left knee, not elsewhere classified: Secondary | ICD-10-CM | POA: Insufficient documentation

## 2022-12-06 DIAGNOSIS — R262 Difficulty in walking, not elsewhere classified: Secondary | ICD-10-CM | POA: Insufficient documentation

## 2022-12-06 DIAGNOSIS — M25651 Stiffness of right hip, not elsewhere classified: Secondary | ICD-10-CM | POA: Diagnosis not present

## 2022-12-06 DIAGNOSIS — M25652 Stiffness of left hip, not elsewhere classified: Secondary | ICD-10-CM | POA: Diagnosis not present

## 2022-12-06 DIAGNOSIS — M6281 Muscle weakness (generalized): Secondary | ICD-10-CM | POA: Diagnosis not present

## 2022-12-11 ENCOUNTER — Ambulatory Visit: Payer: Medicare HMO | Admitting: Physical Therapy

## 2022-12-11 DIAGNOSIS — R262 Difficulty in walking, not elsewhere classified: Secondary | ICD-10-CM

## 2022-12-11 DIAGNOSIS — M25661 Stiffness of right knee, not elsewhere classified: Secondary | ICD-10-CM | POA: Diagnosis not present

## 2022-12-11 DIAGNOSIS — M6281 Muscle weakness (generalized): Secondary | ICD-10-CM

## 2022-12-11 DIAGNOSIS — M25662 Stiffness of left knee, not elsewhere classified: Secondary | ICD-10-CM | POA: Diagnosis not present

## 2022-12-11 DIAGNOSIS — M25652 Stiffness of left hip, not elsewhere classified: Secondary | ICD-10-CM | POA: Diagnosis not present

## 2022-12-11 DIAGNOSIS — M25651 Stiffness of right hip, not elsewhere classified: Secondary | ICD-10-CM | POA: Diagnosis not present

## 2022-12-11 NOTE — Therapy (Signed)
OUTPATIENT PHYSICAL THERAPY TREATMENT NOTE/Re-Eval/Re-Cert   Patient Name: Jenna Kelley MRN: [id number] DOB:[birth year], 72 y.o., female Today's Date: 12/11/2022  PCP: Lewis Moccasin, MD   REFERRING PROVIDER: Lewis Moccasin, MD    END OF SESSION:   PT End of Session - 12/11/22 1016     Visit Number 11    Number of Visits 13    Date for PT Re-Evaluation 12/29/22    Authorization Type [employer] H[employer]    Authorization Time Period 11/13/22-12/29/21;    Progress Note Due on Visit 10    PT Start Time 1015    PT Stop Time 1100    PT Time Calculation (min) 45 min               Past Medical History:  Diagnosis Date   Cataract    DVT (deep venous thrombosis) (HCC)    left leg   Osteoarthritis    shoulder   Umbilical hernia    Past Surgical History:  Procedure Laterality Date   NO PAST SURGERIES     Patient Active Problem List   Diagnosis Date Noted   Acute deep vein thrombosis (DVT) of calf muscle vein of left lower extremity (HCC) 10/06/2019   DVT (deep venous thrombosis) (HCC) 10/05/2019   Hypokalemia 10/05/2019    REFERRING DIAG: physical deconditioning   THERAPY DIAG:  Difficulty in walking, not elsewhere classified  Muscle weakness (generalized)  Rationale for Evaluation and Treatment Rehabilitation  PERTINENT HISTORY: OA, osteoporosis, cataracts   PRECAUTIONS: Fall   SUBJECTIVE:                                                                                                                                                                                      SUBJECTIVE STATEMENT:  Pt reports she is moving better and would like to continue PT. Pt denies pain.  PAIN: . Are you having pain? No   OBJECTIVE: (objective measures completed at initial evaluation unless otherwise dated)   DIAGNOSTIC FINDINGS: No imaging for the knees or hips   PATIENT SURVEYS:  FOTO: Perceived function   28%, predicted   45%  11/08/22=59%    COGNITION:           Overall cognitive status: Within functional limits for tasks assessed                          SENSATION: WFL   EDEMA:  None   POSTURE: rounded shoulders, forward head, and flexed trunk , knee valgus, hip IR (Knocked kneed and        Pigeon toed)   PALPATION: NT   LOWER  EXTREMITY ROM:   Passive ROM Right eval Left eval Right 10/23/22 Left 10/23/22 Right 11/16/22 Right 12/11/22 Left 12/11/22  Hip flexion 60 80 65 95 65 65 95  Hip extension           Hip abduction     0 0     Hip adduction           Hip internal rotation Markedly limited Markedly limited       Hip external rotation Markedly limited Markedly limited       Knee flexion 70 85       Knee extension 5 lacking 12 lacking       Ankle dorsiflexion           Ankle plantarflexion           Ankle inversion           Ankle eversion            (Blank rows = not tested)   LOWER EXTREMITY MMT:   MMT Right eval Left eval Right 10/23/22 Left 10/23/22 Rt 11/08/22 Lt 11/08/22  Hip flexion 2 3 2+ 3+ 3 4-   Hip extension 2 2+   3+ 3+  Hip abduction 2 3   3+ 3+  Hip adduction          Hip internal rotation          Hip external rotation          Knee flexion 5 5      Knee extension 5 5      Ankle dorsiflexion          Ankle plantarflexion          Ankle inversion          Ankle eversion           (Blank rows = not tested)   LOWER EXTREMITY SPECIAL TESTS:  NT   FUNCTIONAL TESTS:  Timed up and go (TUG): 44" 2 minute walk test: 176 feet 10/02/22; 10/23/22: 108 feet  11/02/22 158ft c rollator Sit to stand is labored and requires use of arms   GAIT: Distance walked: 150ft Assistive device utilized: Environmental consultant - 4 wheeled Level of assistance: Modified independence Comments: decreased pace, postural deformities       TODAY'S TREATMENT: [facility] Adult PT Treatment:                                                DATE: 12/11/22 Therapeutic Exercise: Standing hip abduction 10 x 2 -unable to lift LLE  today Standing March 10 x 2  Standing heel raises 10 x 2 Supine AAROM hip flexion x 10 each Side right hip abduction x 10 Side clam right x 10 Seated hip ER x 10 LAQ 10 x 2  STS from elevated mat- using UE on rollator to promote lumbar/hip flexion   [facility] Adult PT Treatment:                                                DATE: 12/06/21 Therapeutic Exercise: Nustep L4 6 mins UE/LE SLR 2x10 R 4#, L 0# Supine hip abd 2x10 R 4#, L 0# STS for raised bari-mat table x5 c hand A  Standing hip abduction x15 each LAQ 2x10 Manual Therapy: AAROM for hip and knee mobility bilat   [facility] Adult PT Treatment:                                                DATE: 11/21/22 Therapeutic Exercise: Seated LAQ 4# x 30 each Seated March 4# x 20 each  Standing march x15 each Standing hip abduction x15 each Standing runners stretch at rollator x 10 each , 5 sec  Standing ankle PF x20, DF x20 Seated lumbar stretch- pushing Rollator away and back- attempting nose over toes position STS high mat, hands on rollator to emphasize nose over toes  Therapeutic Activity: STS x10 STS high mat, hands on rollator to emphasize nose over toes    [facility] Adult PT Treatment:                                                DATE: 11/16/22 Therapeutic Exercise: Seated LAQ 4# x 30 each Seated March 4# x 20 each  Standing march Standing hip abduction Standing runners stretch at rollator x 10 each , 5 sec  Supine Bridge - ball btw knees for alignment 10 x 2  LTR with Ball between knees.  Supine knee to chest with sheet pull x 5 each  Seated lumbar stretch- pushing Rollator away and back- attempting nose over toes position STS high mat, hands on rollator to emphasize nose over toes    [facility] Adult PT Treatment:                                                DATE: 11/08/22 Therapeutic Exercise: LAQ 3# 3x10 each  Partial squats at FM x10  Standing hip abd 2x10 3# at FM,  Standing hip marching 2x10 3# at FM Heel lifts  2x10 at [facility] MMT Therapeutic Activities: FOTO- Completion and review   [facility] Adult PT Treatment:                                                DATE: 11/02/22 Therapeutic Exercise: Nustep L2 UE/LE x 5 minutes  LAQ 3# 3x10 each  Partial squats at FM x10  Standing hip abd 2x10 3# at FM,  Standing hip marching 2x10 3# at [facility] Heel lifts 2x10 at [facility] Adult PT Treatment:                                                DATE: 10/23/22 Therapeutic Exercise: Nustep L2 UE/LE x 5 minutes  2 MWT 108 Feet  LAQ 3# each  Hip Flexion Alternating, 3# on Lt Supine heel slide Right and left - improved ability for active Right heel slides Passive knee to chest  Passive hip abduction    PATIENT EDUCATION:  Education details: Eval findings, POC, HEP Person  educated: Patient and Caregiver sister Education method: Explanation, Demonstration, Tactile cues, and Verbal cues Education comprehension: verbalized understanding, returned demonstration, verbal cues required, tactile cues required, and needs further education     HOME EXERCISE PROGRAM: Access Code: [email or web address] URL: [email or web address] Date: 09/20/2022 Prepared by: Joellyn Rued   Exercises - Sit to Stand with Counter Support  - 2-3 x daily - 7 x weekly - 1 sets - 10 reps - 3 hold - Seated Hip Abduction with Resistance  - 2-3 x daily - 7 x weekly - 1 sets - 10 reps - 3 hold - Seated Knee Lifts with Resistance  - 2-3 x daily - 7 x weekly - 1 sets - 10 reps - 3 hold Added 10/02/22 - Bridge 2 x per day, 7 x per week 2 sets- 10 reps   ASSESSMENT:   CLINICAL IMPRESSION: Objective measures for hips remain unchanged. Continued working on STS and LE ROM with pt being limited by stiffness and severe Valgus in knees.  OBJECTIVE IMPAIRMENTS Abnormal gait, decreased activity tolerance, decreased endurance, difficulty walking, decreased ROM, decreased strength, impaired flexibility, and postural dysfunction.    ACTIVITY LIMITATIONS  carrying, lifting, bending, sitting, standing, squatting, stairs, transfers, bed mobility, bathing, toileting, dressing, hygiene/grooming, and locomotion level   PARTICIPATION LIMITATIONS: meal prep, cleaning, laundry, shopping, and community activity   PERSONAL FACTORS Age, Fitness, Past/current experiences, Time since onset of injury/illness/exacerbation, and 3+ comorbidities:    OA, osteoporosis, cataracts are also affecting patient's functional outcome.    REHAB POTENTIAL: Good   CLINICAL DECISION MAKING: Stable/uncomplicated   EVALUATION COMPLEXITY: Low     GOALS:   SHORT TERM GOALS: Target date: 10/12/2022  Pt will be Ind in an initial HEP Baseline: initiated Status: 10/23/22: verbally reports compliance  Goal status: PARTIALLY MET   LONG TERM GOALS: Target date: 12/29/22   Pt will be Ind in a final HEP to maintain achieved LOF Baseline: initiated Goal status: Ongoing   2.  Improve TUG by 10" as indication of improved functional mobility  Baseline:  Goal status: Deferred- not a good measure for assessing pt's function   3.  Improve by MCID of 30ft as indication of improved functional mobility and community ambulation Baseline: 176 feet Status: 10/23/22 : 108 feet 11/02/22: 192 ft (224ft is the goal) Goal status: Improved   4.  Increase LE strength to the R to 3/5 and the L to 3+/5 for improved safety with functional mobility Baseline: see flow sheet Status: 10/23/22: Left hip flexion 3+/5 11/08/22= Goal status: ONGOING   5.  Pt's FOTO score will improved to the predicted value of 45% as indication of improved function  Baseline: 28% Status: 59% Goal status: MET   PLAN: PT FREQUENCY: 1x/week   PT DURATION: 6 weeks   PLANNED INTERVENTIONS: Therapeutic exercises, Therapeutic activity, Balance training, Gait training, Patient/Family education, Self Care, Joint mobilization, Stair training, Aquatic Therapy, Dry Needling, Cryotherapy, Moist heat, Taping,  Ultrasound, Ionotophoresis 4mg /ml Dexamethasone, Manual therapy, and Re-evaluation   PLAN FOR NEXT SESSION:  passive Hip ROM, functional strengthening, Progress therex as indicated; use of modalities, manual therapy; and TPDN as indicated.   , PTA 12/11/22 11:50 AM Phone: [phone] Fax: [phone]

## 2022-12-19 DIAGNOSIS — F331 Major depressive disorder, recurrent, moderate: Secondary | ICD-10-CM | POA: Diagnosis not present

## 2022-12-19 DIAGNOSIS — F411 Generalized anxiety disorder: Secondary | ICD-10-CM | POA: Diagnosis not present

## 2022-12-19 DIAGNOSIS — G4700 Insomnia, unspecified: Secondary | ICD-10-CM | POA: Diagnosis not present

## 2022-12-19 DIAGNOSIS — M1990 Unspecified osteoarthritis, unspecified site: Secondary | ICD-10-CM | POA: Diagnosis not present

## 2022-12-21 ENCOUNTER — Encounter: Payer: Self-pay | Admitting: Physical Therapy

## 2022-12-21 ENCOUNTER — Ambulatory Visit: Payer: Medicare HMO | Admitting: Physical Therapy

## 2022-12-21 DIAGNOSIS — M25661 Stiffness of right knee, not elsewhere classified: Secondary | ICD-10-CM | POA: Diagnosis not present

## 2022-12-21 DIAGNOSIS — R262 Difficulty in walking, not elsewhere classified: Secondary | ICD-10-CM | POA: Diagnosis not present

## 2022-12-21 DIAGNOSIS — M6281 Muscle weakness (generalized): Secondary | ICD-10-CM | POA: Diagnosis not present

## 2022-12-21 DIAGNOSIS — M25662 Stiffness of left knee, not elsewhere classified: Secondary | ICD-10-CM | POA: Diagnosis not present

## 2022-12-21 DIAGNOSIS — M25651 Stiffness of right hip, not elsewhere classified: Secondary | ICD-10-CM | POA: Diagnosis not present

## 2022-12-21 DIAGNOSIS — M25652 Stiffness of left hip, not elsewhere classified: Secondary | ICD-10-CM | POA: Diagnosis not present

## 2022-12-21 NOTE — Therapy (Addendum)
OUTPATIENT PHYSICAL THERAPY TREATMENT NOTE/Re-Eval/Re-Cert/DIscharge   Patient Name: Jenna NorfolkClementine Landgren MRN: [id number] DOB:Feb 02, 1951, 72 y.o., female Today's Date: 12/21/2022  PCP: Lewis Moccasinewey, Elizabeth R, MD   REFERRING PROVIDER: Lewis Moccasinewey, Elizabeth R, MD    END OF SESSION:   PT End of Session - 12/21/22 0846     Visit Number 12    Number of Visits 13    Date for PT Re-Evaluation 12/29/22    Authorization Type [employer] H[employer]    Authorization Time Period 11/13/22-12/29/21;    Authorization - Visit Number 12    Authorization - Number of Visits 12    PT Start Time [phone]    PT Stop Time 0930    PT Time Calculation (min) 43 min               Past Medical History:  Diagnosis Date   Cataract    DVT (deep venous thrombosis) (HCC)    left leg   Osteoarthritis    shoulder   Umbilical hernia    Past Surgical History:  Procedure Laterality Date   NO PAST SURGERIES     Patient Active Problem List   Diagnosis Date Noted   Acute deep vein thrombosis (DVT) of calf muscle vein of left lower extremity (HCC) 10/06/2019   DVT (deep venous thrombosis) (HCC) 10/05/2019   Hypokalemia 10/05/2019    REFERRING DIAG: physical deconditioning   THERAPY DIAG:  Difficulty in walking, not elsewhere classified  Muscle weakness (generalized)  Rationale for Evaluation and Treatment Rehabilitation  PERTINENT HISTORY: OA, osteoporosis, cataracts   PRECAUTIONS: Fall   SUBJECTIVE:                                                                                                                                                                                      SUBJECTIVE STATEMENT:  Pt reports she went to Maryelizabeth RowanElizabeth Dewey, MD and she was pleased with her function. Pt reports she is walking with cane at home for practice. MD might give her some injections in the knees.   PAIN: . Are you having pain? No   OBJECTIVE: (objective measures completed at initial evaluation unless otherwise  dated)   DIAGNOSTIC FINDINGS: No imaging for the knees or hips   PATIENT SURVEYS:  FOTO: Perceived function   28%, predicted   45%  11/08/22=59%   COGNITION:           Overall cognitive status: Within functional limits for tasks assessed                          SENSATION: WFL   EDEMA:  None   POSTURE: rounded shoulders,  forward head, and flexed trunk , knee valgus, hip IR (Knocked kneed and        Pigeon toed)   PALPATION: NT   LOWER EXTREMITY ROM:   Passive ROM Right eval Left eval Right 10/23/22 Left 10/23/22 Right 11/16/22 Right 12/11/22 Left 12/11/22  Hip flexion 60 80 65 95 65 65 95  Hip extension           Hip abduction     0 0     Hip adduction           Hip internal rotation Markedly limited Markedly limited       Hip external rotation Markedly limited Markedly limited       Knee flexion 70 85       Knee extension 5 lacking 12 lacking       Ankle dorsiflexion           Ankle plantarflexion           Ankle inversion           Ankle eversion            (Blank rows = not tested)   LOWER EXTREMITY MMT:   MMT Right eval Left eval Right 10/23/22 Left 10/23/22 Rt 11/08/22 Lt 11/08/22 RT/LT 12/21/22  Hip flexion 2 3 2+ 3+ 3 4-  3 / 4  Hip extension 2 2+   3+ 3+   Hip abduction 2 3   3+ 3+   Hip adduction           Hip internal rotation           Hip external rotation           Knee flexion 5 5       Knee extension 5 5       Ankle dorsiflexion           Ankle plantarflexion           Ankle inversion           Ankle eversion            (Blank rows = not tested)   LOWER EXTREMITY SPECIAL TESTS:  NT   FUNCTIONAL TESTS:  Timed up and go (TUG): 44" 2 minute walk test: 176 feet 10/02/22; 10/23/22: 108 feet  11/02/22 127ft c rollator: 200 feet 12/21/22 C rollator Sit to stand is labored and requires use of arms Gait 200 feet in 4 min 20 sec   GAIT: Distance walked: 125ft Assistive device utilized: Environmental consultant - 4 wheeled Level of assistance: Modified  independence Comments: decreased pace, postural deformities       TODAY'S TREATMENT: [facility] Adult PT Treatment:                                                DATE: 12/21/22 Therapeutic Exercise: Seated review of marching and clam, band at knees Verbal review of previusly performed standing hip flexion and abduction+ to HEP and reprinted entire HEP  Therapeutic Activity: Gait with rollator 215 Ft Gait with rollator 185 Ft  2 MWT 200 feet with rollator Gait with SPC  100 Feet     [facility] Adult PT Treatment:  DATE: 12/11/22 Therapeutic Exercise: Standing hip abduction 10 x 2 -unable to lift LLE today Standing March 10 x 2  Standing heel raises 10 x 2 Supine AAROM hip flexion x 10 each Side right hip abduction x 10 Side clam right x 10 Seated hip ER x 10 LAQ 10 x 2  STS from elevated mat- using UE on rollator to promote lumbar/hip flexion   [facility] Adult PT Treatment:                                                DATE: 12/06/21 Therapeutic Exercise: Nustep L4 6 mins UE/LE SLR 2x10 R 4#, L 0# Supine hip abd 2x10 R 4#, L 0# STS for raised bari-mat table x5 c hand A Standing hip abduction x15 each LAQ 2x10 Manual Therapy: AAROM for hip and knee mobility bilat   [facility] Adult PT Treatment:                                                DATE: 11/21/22 Therapeutic Exercise: Seated LAQ 4# x 30 each Seated March 4# x 20 each  Standing march x15 each Standing hip abduction x15 each Standing runners stretch at rollator x 10 each , 5 sec  Standing ankle PF x20, DF x20 Seated lumbar stretch- pushing Rollator away and back- attempting nose over toes position STS high mat, hands on rollator to emphasize nose over toes  Therapeutic Activity: 2MWT STS x10 STS high mat, hands on rollator to emphasize nose over toes    [facility] Adult PT Treatment:                                                DATE: 11/16/22 Therapeutic Exercise: Seated LAQ 4# x 30  each Seated March 4# x 20 each  Standing march Standing hip abduction Standing runners stretch at rollator x 10 each , 5 sec  Supine Bridge - ball btw knees for alignment 10 x 2  LTR with Ball between knees.  Supine knee to chest with sheet pull x 5 each  Seated lumbar stretch- pushing Rollator away and back- attempting nose over toes position STS high mat, hands on rollator to emphasize nose over toes    [facility] Adult PT Treatment:                                                DATE: 11/08/22 Therapeutic Exercise: LAQ 3# 3x10 each  Partial squats at FM x10  Standing hip abd 2x10 3# at FM,  Standing hip marching 2x10 3# at FM Heel lifts 2x10 at [facility] MMT Therapeutic Activities: FOTO- Completion and review    PATIENT EDUCATION:  Education details: Eval findings, POC, HEP Person educated: Patient and Caregiver sister Education method: Explanation, Demonstration, Tactile cues, and Verbal cues Education comprehension: verbalized understanding, returned demonstration, verbal cues required, tactile cues required, and needs further education     HOME EXERCISE PROGRAM: Access Code: [email or web address] URL: [email or web address] Date:  09/20/2022 Prepared by: Gar Ponto   Exercises - Sit to Stand with Counter Support  - 2-3 x daily - 7 x weekly - 1 sets - 10 reps - 3 hold - Seated Hip Abduction with Resistance  - 2-3 x daily - 7 x weekly - 1 sets - 10 reps - 3 hold - Seated Knee Lifts with Resistance  - 2-3 x daily - 7 x weekly - 1 sets - 10 reps - 3 hold Added 10/02/22 - Bridge 2 x per day, 7 x per week 2 sets- 10 reps   ASSESSMENT:   CLINICAL IMPRESSION: Objective measures for right hip remain unchanged. Left hip strength has improved for flexion. She saw MD yesterday and discussed potential knee injections in March. Asked patient and her sister to have MD address her right hip mobility as it is a contributing factor. She reports that she is walking in home with [facility] for practice  and exercise. Significantly decrease step length and gait velocity when using SPC vs Rolator in clinic. She also sat on her Rolator in clinic without propping it against something strudy. She then required asssit to rise due to her Rolator sliding out from under her. He was encouraged to continue mostly with Rolator at this time due to balance and safety. She was also reminded that the Rolator bar in the front is not meant for the amount of force she applying on it with her UE to rise.  She also had a LOB without fall - able to grab Rolator, while donning coat at end of session. She has reached the end of her POC. She has met her FOTO goal. All other goals not mer or partially met. She plans to continue her HEP and follow up with MD for further guidance,    OBJECTIVE IMPAIRMENTS Abnormal gait, decreased activity tolerance, decreased endurance, difficulty walking, decreased ROM, decreased strength, impaired flexibility, and postural dysfunction.    ACTIVITY LIMITATIONS carrying, lifting, bending, sitting, standing, squatting, stairs, transfers, bed mobility, bathing, toileting, dressing, hygiene/grooming, and locomotion level   PARTICIPATION LIMITATIONS: meal prep, cleaning, laundry, shopping, and community activity   PERSONAL FACTORS Age, Fitness, Past/current experiences, Time since onset of injury/illness/exacerbation, and 3+ comorbidities:    OA, osteoporosis, cataracts are also affecting patient's functional outcome.    REHAB POTENTIAL: Good   CLINICAL DECISION MAKING: Stable/uncomplicated   EVALUATION COMPLEXITY: Low     GOALS:   SHORT TERM GOALS: Target date: 10/12/2022  Pt will be Ind in an initial HEP Baseline: initiated Status: 10/23/22: verbally reports compliance  12/21/22: performs seated clam and march  Goal status: PARTIALLY MET   LONG TERM GOALS: Target date: 12/29/22   Pt will be Ind in a final HEP to maintain achieved LOF Baseline: initiated 12/21/22: performs some.   Goal status:  PARTIALLY MET  2.  Improve TUG by 10" as indication of improved functional mobility  Baseline:  Goal status: Deferred- not a good measure for assessing pt's function   3.  Improve 2MWT by MCID of 23ft as indication of improved functional mobility and community ambulation Baseline: 176 feet Status: 10/23/22 : 108 feet 11/02/22: 192 ft; 12/21/22: 200Feet (249ft is the goal) Goal status: Improved but NOT MET   4.  Increase LE strength to the R to 3/5 and the L to 3+/5 for improved safety with functional mobility Baseline: see flow sheet Status: 10/23/22: Left hip flexion 3+/5  12/21/22: left hip flexion 4/5 ; right 3/5 Goal status: NOT MET  5.  Pt's FOTO score will improved to the predicted value of 45% as indication of improved function  Baseline: 28% Status: 59% Goal status: MET   PLAN: PT FREQUENCY: 1x/week   PT DURATION: 6 weeks   PLANNED INTERVENTIONS: Therapeutic exercises, Therapeutic activity, Balance training, Gait training, Patient/Family education, Self Care, Joint mobilization, Stair training, Aquatic Therapy, Dry Needling, Cryotherapy, Moist heat, Taping, Ultrasound, Ionotophoresis 4mg /ml Dexamethasone, Manual therapy, and Re-evaluation   PLAN FOR NEXT SESSION:  N/A discharge today.   Jannette Spanner, PTA 12/21/22 10:55 AM Phone: [phone] Fax: [phone]   PHYSICAL THERAPY DISCHARGE SUMMARY  Visits from Start of Care: 12  Current functional level related to goals / functional outcomes: See clinical impression and PT goals    Remaining deficits: See clinical impression and PT goals    Education / Equipment: HEP   Patient agrees to discharge. Patient goals were partially met. Patient is being discharged due to lack of progress.  Allen Ralls MS, PT 03/12/23 8:29 AM

## 2022-12-24 ENCOUNTER — Other Ambulatory Visit: Payer: Self-pay | Admitting: Family Medicine

## 2022-12-24 DIAGNOSIS — Z1231 Encounter for screening mammogram for malignant neoplasm of breast: Secondary | ICD-10-CM

## 2023-02-12 ENCOUNTER — Other Ambulatory Visit: Payer: Self-pay | Admitting: Family Medicine

## 2023-02-12 DIAGNOSIS — I10 Essential (primary) hypertension: Secondary | ICD-10-CM | POA: Diagnosis not present

## 2023-02-12 DIAGNOSIS — R443 Hallucinations, unspecified: Secondary | ICD-10-CM | POA: Diagnosis not present

## 2023-02-12 DIAGNOSIS — F331 Major depressive disorder, recurrent, moderate: Secondary | ICD-10-CM | POA: Diagnosis not present

## 2023-02-12 DIAGNOSIS — G4700 Insomnia, unspecified: Secondary | ICD-10-CM | POA: Diagnosis not present

## 2023-02-12 DIAGNOSIS — F411 Generalized anxiety disorder: Secondary | ICD-10-CM | POA: Diagnosis not present

## 2023-02-12 DIAGNOSIS — R413 Other amnesia: Secondary | ICD-10-CM | POA: Diagnosis not present

## 2023-02-13 ENCOUNTER — Ambulatory Visit
Admission: RE | Admit: 2023-02-13 | Discharge: 2023-02-13 | Disposition: A | Discharge status: Home / Self Care | Payer: Medicare HMO | Source: Ambulatory Visit | Attending: Family Medicine | Admitting: Family Medicine

## 2023-02-13 DIAGNOSIS — R443 Hallucinations, unspecified: Secondary | ICD-10-CM

## 2023-02-13 DIAGNOSIS — R413 Other amnesia: Secondary | ICD-10-CM | POA: Diagnosis not present

## 2023-02-14 ENCOUNTER — Ambulatory Visit
Admission: RE | Admit: 2023-02-14 | Discharge: 2023-02-14 | Disposition: A | Discharge status: Home / Self Care | Payer: Medicare HMO | Source: Ambulatory Visit | Attending: Family Medicine | Admitting: Family Medicine

## 2023-02-14 DIAGNOSIS — Z1231 Encounter for screening mammogram for malignant neoplasm of breast: Secondary | ICD-10-CM

## 2023-02-18 DIAGNOSIS — H9190 Unspecified hearing loss, unspecified ear: Secondary | ICD-10-CM | POA: Diagnosis not present

## 2023-02-18 DIAGNOSIS — M170 Bilateral primary osteoarthritis of knee: Secondary | ICD-10-CM | POA: Diagnosis not present

## 2023-02-18 DIAGNOSIS — F331 Major depressive disorder, recurrent, moderate: Secondary | ICD-10-CM | POA: Diagnosis not present

## 2023-02-18 DIAGNOSIS — G4700 Insomnia, unspecified: Secondary | ICD-10-CM | POA: Diagnosis not present

## 2023-02-18 DIAGNOSIS — F411 Generalized anxiety disorder: Secondary | ICD-10-CM | POA: Diagnosis not present

## 2023-03-13 DIAGNOSIS — E559 Vitamin D deficiency, unspecified: Secondary | ICD-10-CM | POA: Diagnosis not present

## 2023-03-19 DIAGNOSIS — E559 Vitamin D deficiency, unspecified: Secondary | ICD-10-CM | POA: Diagnosis not present

## 2023-03-19 DIAGNOSIS — H9190 Unspecified hearing loss, unspecified ear: Secondary | ICD-10-CM | POA: Diagnosis not present

## 2023-03-19 DIAGNOSIS — F411 Generalized anxiety disorder: Secondary | ICD-10-CM | POA: Diagnosis not present

## 2023-03-19 DIAGNOSIS — G4700 Insomnia, unspecified: Secondary | ICD-10-CM | POA: Diagnosis not present

## 2023-03-19 DIAGNOSIS — F331 Major depressive disorder, recurrent, moderate: Secondary | ICD-10-CM | POA: Diagnosis not present

## 2023-05-14 DIAGNOSIS — H9190 Unspecified hearing loss, unspecified ear: Secondary | ICD-10-CM | POA: Diagnosis not present

## 2023-05-14 DIAGNOSIS — M1990 Unspecified osteoarthritis, unspecified site: Secondary | ICD-10-CM | POA: Diagnosis not present

## 2023-05-14 DIAGNOSIS — R5381 Other malaise: Secondary | ICD-10-CM | POA: Diagnosis not present

## 2023-05-24 ENCOUNTER — Other Ambulatory Visit (HOSPITAL_COMMUNITY): Payer: Self-pay | Admitting: Emergency Medicine

## 2023-05-24 ENCOUNTER — Encounter (HOSPITAL_COMMUNITY): Payer: Self-pay

## 2023-05-24 ENCOUNTER — Telehealth (HOSPITAL_COMMUNITY): Payer: Self-pay | Admitting: Family Medicine

## 2023-05-24 ENCOUNTER — Ambulatory Visit (HOSPITAL_COMMUNITY)
Admission: RE | Admit: 2023-05-24 | Discharge: 2023-05-24 | Disposition: A | Discharge status: Home / Self Care | Payer: Medicare HMO | Source: Ambulatory Visit | Attending: Cardiovascular Disease | Admitting: Cardiovascular Disease

## 2023-05-24 ENCOUNTER — Ambulatory Visit (HOSPITAL_BASED_OUTPATIENT_CLINIC_OR_DEPARTMENT_OTHER)
Admission: RE | Admit: 2023-05-24 | Discharge: 2023-05-24 | Disposition: A | Discharge status: Home / Self Care | Payer: Medicare HMO | Source: Ambulatory Visit | Attending: Vascular Surgery | Admitting: Vascular Surgery

## 2023-05-24 ENCOUNTER — Other Ambulatory Visit (HOSPITAL_COMMUNITY): Payer: Self-pay

## 2023-05-24 ENCOUNTER — Ambulatory Visit (HOSPITAL_COMMUNITY)
Admission: EM | Admit: 2023-05-24 | Discharge: 2023-05-24 | Disposition: A | Discharge status: Home / Self Care | Payer: Medicare HMO | Attending: Emergency Medicine | Admitting: Emergency Medicine

## 2023-05-24 VITALS — BP 107/62 | HR 66

## 2023-05-24 DIAGNOSIS — M7989 Other specified soft tissue disorders: Secondary | ICD-10-CM

## 2023-05-24 DIAGNOSIS — I82411 Acute embolism and thrombosis of right femoral vein: Secondary | ICD-10-CM

## 2023-05-24 DIAGNOSIS — R600 Localized edema: Secondary | ICD-10-CM

## 2023-05-24 MED ORDER — APIXABAN (ELIQUIS) VTE STARTER PACK (10MG AND 5MG)
ORAL_TABLET | ORAL | 0 refills | Status: DC
  Filled 2023-05-24: qty 74, 30d supply, fill #0

## 2023-05-24 NOTE — ED Provider Notes (Signed)
[facility]    CSN: [id number] Arrival date & time: 05/24/23  0102      History   Chief Complaint Chief Complaint  Patient presents with   Joint Swelling    Right foot    HPI Jenna Kelley is a 72 y.o. female.   Patient presents to clinic for right lower leg swelling that has been intermittent for the past 3 weeks.  Over the weekend she did go to a local family reunion at [facility] and then [place], she was walking and on her feet a lot, uses her walker for ambulation.   Her right foot and right lower leg up to her mid calf have pitting edema.  She denies any pain.  Denies any shortness of breath, wheezing or cough.  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    The history is provided by the patient and medical records.    Past Medical History:  Diagnosis Date   Cataract    DVT (deep venous thrombosis) (HCC)    left leg   Osteoarthritis    shoulder   Umbilical hernia     Patient Active Problem List   Diagnosis Date Noted   Acute deep vein thrombosis (DVT) of calf muscle vein of left lower extremity (HCC) 10/06/2019   DVT (deep venous thrombosis) (HCC) 10/05/2019   Hypokalemia 10/05/2019    Past Surgical History:  Procedure Laterality Date   NO PAST SURGERIES      OB History   No obstetric history on file.      Home Medications    Prior to Admission medications   Medication Sig Start Date End Date Taking? Authorizing Provider  Multiple Vitamin (MULTIVITAMIN WITH MINERALS) TABS tablet Take 1 tablet by mouth daily.   Yes [provider]  APIXABAN Everlene Balls) VTE STARTER PACK (10MG  AND 5MG ) Take as directed on package: start with two-5mg  tablets twice daily for 7 days. On day 8, switch to one-5mg  tablet twice daily. 05/24/23   Pervis Hocking B, RPH-CPP  sertraline (ZOLOFT) 100 MG tablet Take 100 mg by mouth every morning. 03/11/23   [provider]    Family History Family History  Problem  Relation Age of Onset   Diabetes Mother    Diabetes Sister    Hypertension Sister    Lung cancer Sister    Breast cancer Neg Hx     Social History Social History   Tobacco Use   Smoking status: Never   Smokeless tobacco: Never  Substance Use Topics   Alcohol use: No   Drug use: Not Currently     Allergies   Patient has no known allergies.   Review of Systems Review of Systems  Constitutional:  Negative for fever.  Respiratory:  Negative for cough, shortness of breath and wheezing.   Cardiovascular:  Positive for leg swelling. Negative for chest pain.     Physical Exam Triage Vital Signs ED Triage Vitals  Enc Vitals Group     BP 05/24/23 0832 (!) 153/63     Pulse Rate 05/24/23 0832 68     Resp 05/24/23 0832 17     Temp 05/24/23 0832 98.4 F (36.9 C)     Temp Source 05/24/23 0832 Oral     SpO2 05/24/23 0832 95 %     Weight 05/24/23 0831 174 lb (78.9 kg)     Height 05/24/23 0831 5\' 5"  (1.651 m)     Head Circumference --  Peak Flow --      Pain Score 05/24/23 0831 0     Pain Loc --      Pain Edu? --      Excl. in GC? --    No data found.  Updated Vital Signs BP (!) 153/63 (BP Location: Right Arm)   Pulse 68   Temp 98.4 F (36.9 C) (Oral)   Resp 17   Ht 5\' 5"  (1.651 m)   Wt 174 lb (78.9 kg)   SpO2 95%   BMI 28.96 kg/m   Visual Acuity Right Eye Distance:   Left Eye Distance:   Bilateral Distance:    Right Eye Near:   Left Eye Near:    Bilateral Near:     Physical Exam Vitals and nursing note reviewed.  Constitutional:      Appearance: Normal appearance.  HENT:     Head: Normocephalic and atraumatic.     Right Ear: External ear normal.     Left Ear: External ear normal.     Nose: Nose normal.     Mouth/Throat:     Mouth: Mucous membranes are moist.  Eyes:     Conjunctiva/sclera: Conjunctivae normal.  Cardiovascular:     Rate and Rhythm: Normal rate and regular rhythm.     Heart sounds: Normal heart sounds. No murmur  heard. Pulmonary:     Effort: Pulmonary effort is normal. No respiratory distress.  Musculoskeletal:        General: No tenderness, deformity or signs of injury. Normal range of motion.     Right lower leg: 2+ Pitting Edema present.  Skin:    General: Skin is warm and dry.     Capillary Refill: Capillary refill takes less than 2 seconds.  Neurological:     General: No focal deficit present.     Mental Status: She is alert.  Psychiatric:        Mood and Affect: Mood normal.        Behavior: Behavior is cooperative.      UC Treatments / Results  Labs (all labs ordered are listed, but only abnormal results are displayed) Labs Reviewed - No data to display  EKG   Radiology LE VENOUS  Result Date: 05/24/2023  Lower Venous DVT Study Patient Name:  Jenna Kelley  Date of Exam:   05/24/2023 Medical Rec #: [id number]            Accession #:    [id number] Date of Birth: Jul 17, 1951           Patient Gender: F Patient Age:   69 years Exam Location:  [facility] Procedure:      VAS [place] LOWER EXTREMITY VENOUS (DVT) Referring Phys: [place] Karyme Mcconathy --------------------------------------------------------------------------------  Indications: Swelling.  Risk Factors: Patient has limited mobility DVT Hx DVT 11/20 left CFV Patient complains of swelling in her right foot after having to walk a lot at a family reunion. Anticoagulation: Eliquis today. Performing Technologist: Jake Seats RDMS, RVT, RDCS  Examination Guidelines: A complete evaluation includes B-mode imaging, spectral Doppler, color Doppler, and power Doppler as needed of all accessible portions of each vessel. Bilateral testing is considered an integral part of a complete examination. Limited examinations for reoccurring indications may be performed as noted. The reflux portion of the exam is performed with the patient in reverse Trendelenburg.  +---------+---------------+---------+-----------+----------+--------------+ RIGHT     CompressibilityPhasicitySpontaneityPropertiesThrombus Aging +---------+---------------+---------+-----------+----------+--------------+ CFV      Partial        Yes  Yes                  Acute          +---------+---------------+---------+-----------+----------+--------------+ SFJ      Full           Yes      Yes                                 +---------+---------------+---------+-----------+----------+--------------+ FV Prox  Partial        Yes      Yes                  Acute          +---------+---------------+---------+-----------+----------+--------------+ FV Mid   Full           Yes      Yes                                 +---------+---------------+---------+-----------+----------+--------------+ FV DistalFull           Yes      Yes                                 +---------+---------------+---------+-----------+----------+--------------+ PFV      Partial        Yes      Yes                  Acute          +---------+---------------+---------+-----------+----------+--------------+ POP      Full           Yes      Yes                                 +---------+---------------+---------+-----------+----------+--------------+ PTV      Full           Yes      Yes                                 +---------+---------------+---------+-----------+----------+--------------+ Gastroc  Full           Yes      Yes                                 +---------+---------------+---------+-----------+----------+--------------+ GSV      Full           Yes      Yes                                 +---------+---------------+---------+-----------+----------+--------------+ Proximal and distal right EIV are patent . IVC was patent.  +----+---------------+---------+-----------+----------+--------------+ LEFTCompressibilityPhasicitySpontaneityPropertiesThrombus Aging +----+---------------+---------+-----------+----------+--------------+ CFV Full            Yes      Yes                                 +----+---------------+---------+-----------+----------+--------------+ SFJ Full           Yes      Yes                                 +----+---------------+---------+-----------+----------+--------------+  Summary: RIGHT: - Findings consistent with acute deep vein thrombosis involving the right common femoral vein, right femoral vein, and right proximal profunda vein.  LEFT: - No evidence of common femoral vein obstruction.  *See table(s) above for measurements and observations. Electronically signed by Coral Else MD on 05/24/2023 at 11:57:46 AM.    Final     Procedures Procedures (including critical care time)  Medications Ordered in UC Medications - No data to display  Initial Impression / Assessment and Plan / UC Course  I have reviewed the triage vital signs and the nursing notes.  Pertinent labs & imaging results that were available during my care of the patient were reviewed by me and considered in my medical decision making (see chart for details).  Vitals and triage reviewed, patient is hemodynamically stable.  Right lower extremity 2+ pitting edema since this weekend.  Patient has not been bedridden or had surgery, does not have tenderness.  Her pitting edema is confined to the symptomatic leg, does have a history of DVT.  Due to risk factors, will send for DVT study today.  If negative, encouraged to follow-up with primary care provider.  If positive, we will contact for further treatment.  Plan of care, follow-up care and return precautions given, no questions at this time.  US showed femoral DVT in RLE, patient referred to DVT clinic for further management.      Final Clinical Impressions(s) / UC Diagnoses   Final diagnoses:  Edema of right lower leg  Acute deep vein thrombosis (DVT) of femoral vein of right lower extremity [facility])     Discharge Instructions      Our staff will set you up an appointment today  to get an ultrasound to rule out a blood clot in your leg.   We will contact you if the results are positive or negative.  If negative, please follow-up with your primary care early next week for further evaluation of your right lower extremity edema.  Please seek immediate care if you develop chest pain, shortness of breath, coughing up blood, fevers, or any new concerning symptoms.     ED Prescriptions   None    PDMP not reviewed this encounter.   Rinaldo Ratel [place] N, [place] 05/24/23 [phone]

## 2023-05-24 NOTE — Addendum Note (Signed)
Encounter addended by: Dicie Beam, RPH-CPP on: 05/24/2023 5:00 PM  Actions taken: Order list changed, Diagnosis association updated, Order Reconciliation Section accessed, Pharmacy for encounter modified

## 2023-05-24 NOTE — ED Triage Notes (Signed)
Patient here today with c/o swelling in her right foot on and off for about 3 weeks. H/o DVT 2 years ago and was put on Eliquis temporarily. This past weekend she was at a family reunion and was on her feet a lot. Has been worse since then. No pain.

## 2023-05-24 NOTE — Telephone Encounter (Signed)
Vascular lab called and patient is positive for DVT.  They are going to send her to the DVT clinic.

## 2023-05-24 NOTE — Discharge Instructions (Addendum)
Our staff will set you up an appointment today to get an ultrasound to rule out a blood clot in your leg.   We will contact you if the results are positive or negative.  If negative, please follow-up with your primary care early next week for further evaluation of your right lower extremity edema.  Please seek immediate care if you develop chest pain, shortness of breath, coughing up blood, fevers, or any new concerning symptoms.

## 2023-05-24 NOTE — Progress Notes (Addendum)
DVT Clinic Note  Name: Jenna Kelley     MRN: [id number]     DOB: 1951/11/23     Sex: female  PCP: Jenna Moccasin, MD  Today's Visit: Visit Information: Initial Visit  Referred to DVT Clinic by: Jenna Garrison, FNP [facility])  Referred to CPP by: Dr. Edilia Kelley Reason for referral:  Chief Complaint  Patient presents with   DVT   HISTORY OF PRESENT ILLNESS: Jenna Kelley is a 72 y.o. female who presents after diagnosis of DVT for medication management. Patient presented today to [facility] for RLE swelling for the past 3 weeks. She went to a family reunion this weekend and did a lot of walking which made the swelling worse. She has a history of extensive DVT in her left leg 10/2019 extending from the left external iliac vein through the calf veins. She was admitted to [facility] at that time and the patient declined any intervention by IR at that time. She reports she was treated with Eliquis for a few months then discontinued. Reports she had no bruising or bleeding while on Eliquis previously. No provoking risk factor was found at that time. Doppler today showed acute DVT involving the right common femoral vein. She is accompanied by her sister Jenna Kelley for today's visit and is ambulating with a walker. Patient reports she is having no pain in the leg. Her only complaint is the swelling. Reports that she doesn't sit still for long at home. She walks well with her walker and will run errands or get up and move around her house. She lives with her sister who helps her with her medications.   Positive Thrombotic Risk Factors: Previous VTE, Older Age Bleeding Risk Factors: Age >65 years  Negative Thrombotic Risk Factors: Recent surgery (within 3 months), Recent trauma (within 3 months), Recent admission to hospital with acute illness (within 3 months), Paralysis, paresis, or recent plaster cast immobilization of lower extremity, Central venous catheterization,  Sedentary journey lasting >8 hours within 4 weeks, Pregnancy, Testosterone therapy, Estrogen therapy, Recent cesarean section (within 3 months), Smoking, Obesity, Bed rest >72 hours within 3 months, Within 6 weeks postpartum, Erythropoiesis-stimulating agent, Non-malignant, chronic inflammatory condition, Recent COVID diagnosis (within 3 months), Active cancer, Known thrombophilic condition  Rx Insurance Coverage: Medicare + Extra Help Rx Affordability: Eliquis is $11.20 for a 30 or 90 day supply Preferred Pharmacy: Filled Eliquis starter pack during her visit today at [facility]. I will send refills to [facility] for home delivery per the patient's preference.   Past Medical History:  Diagnosis Date   Cataract    DVT (deep venous thrombosis) (HCC)    left leg   Osteoarthritis    shoulder   Umbilical hernia     Past Surgical History:  Procedure Laterality Date   NO PAST SURGERIES      Social History   Socioeconomic History   Marital status: Widowed    Spouse name: Not on file   Number of children: 0   Years of education: Not on file   Highest education level: Not on file  Occupational History   Occupation: retired  Tobacco Use   Smoking status: Never   Smokeless tobacco: Never  Substance and Sexual Activity   Alcohol use: No   Drug use: Not Currently   Sexual activity: Not Currently  Other Topics Concern   Not on file  Social History Narrative   Not on file  Social Determinants of Health   Financial Resource Strain: Low Risk  (10/09/2019)   Overall Financial Resource Strain (CARDIA)    Difficulty of Paying Living Expenses: Not very hard  Food Insecurity: No Food Insecurity (07/28/2021)   Hunger Vital Sign    Worried About Running Out of Food in the Last Year: Never true    Ran Out of Food in the Last Year: Never true  Transportation Needs: No Transportation Needs (07/28/2021)   PRAPARE - Administrator, Civil Service  (Medical): No    Lack of Transportation (Non-Medical): No  Physical Activity: Not on file  Stress: Not on file  Social Connections: Not on file  Intimate Partner Violence: Not on file    Family History  Problem Relation Age of Onset   Diabetes Mother    Diabetes Sister    Hypertension Sister    Lung cancer Sister    Breast cancer Neg Hx     Allergies as of 05/24/2023   (No Known Allergies)    Current Outpatient Medications on File Prior to Encounter  Medication Sig Dispense Refill   Multiple Vitamin (MULTIVITAMIN WITH MINERALS) TABS tablet Take 1 tablet by mouth daily.     sertraline (ZOLOFT) 100 MG tablet Take 100 mg by mouth every morning.     No current facility-administered medications on file prior to encounter.   REVIEW OF SYSTEMS:  Review of Systems  Respiratory:  Negative for shortness of breath.   Cardiovascular:  Positive for leg swelling. Negative for chest pain and palpitations.  Musculoskeletal:  Negative for joint pain and myalgias.  Neurological:  Negative for dizziness and tingling.   PHYSICAL EXAMINATION:  Vitals:   05/24/23 1515  BP: 107/62  Pulse: 66  SpO2: 99%   Physical Exam Vitals reviewed.  Cardiovascular:     Rate and Rhythm: Normal rate.  Pulmonary:     Effort: Pulmonary effort is normal.  Musculoskeletal:        General: No tenderness.     Right lower leg: Edema (2+ up to mid calf) present.     Left lower leg: No edema.  Skin:    Findings: Erythema present. No bruising.   Villalta Score for Post-Thrombotic Syndrome: Pain: Absent Cramps: Absent Heaviness: Absent Paresthesia: Absent Pruritus: Absent Pretibial Edema: Moderate Skin Induration: Absent Hyperpigmentation: Absent Redness: Mild Venous Ectasia: Absent Pain on calf compression: Absent Villalta Preliminary Score: 3 Is venous ulcer present?: No If venous ulcer is present and score is <15, then 15 points total are assigned: Absent Villalta Total Score: 3  LABS:  CBC      Component Value Date/Time   WBC 3.9 (L) 09/14/2022 1900   RBC 4.39 09/14/2022 1900   HGB 12.6 09/14/2022 1900   HCT 40.4 09/14/2022 1900   PLT 189 09/14/2022 1900   MCV 92.0 09/14/2022 1900   MCH 28.7 09/14/2022 1900   MCHC 31.2 09/14/2022 1900   RDW 13.8 09/14/2022 1900   LYMPHSABS 1.3 09/14/2022 1900   MONOABS 0.4 09/14/2022 1900   EOSABS 0.1 09/14/2022 1900   BASOSABS 0.0 09/14/2022 1900    Hepatic Function      Component Value Date/Time   PROT 5.0 (L) 10/06/2019 0506   ALBUMIN 1.8 (L) 10/06/2019 0506   AST 42 (H) 10/06/2019 0506   ALT 38 10/06/2019 0506   ALKPHOS 55 10/06/2019 0506   BILITOT 0.6 10/06/2019 0506    Renal Function   Lab Results  Component Value Date   CREATININE 0.74 09/14/2022  CREATININE 0.58 10/07/2019   CREATININE 0.71 10/06/2019    CrCl cannot be calculated (Patient's most recent lab result is older than the maximum 21 days allowed.).   More recent labs available in the patient's chart via [facility] are without any concerns:  02/12/23: Hgb 13.2, Plt 184K, serum creatinine 0.64, eGFR 94, AST 20, ALT 13  VVS Vascular Lab Studies:  05/24/23 VAS [place] LOWER EXTREMITY VENOUS (DVT)RIGHT Summary:  RIGHT:  - Findings consistent with acute deep vein thrombosis involving the right  common femoral vein, right femoral vein, and right proximal profunda vein.    LEFT:  - No evidence of common femoral vein obstruction.   ASSESSMENT: Location of DVT: Right common femoral vein, Right femoral vein Cause of DVT: unprovoked - this is the patient's second extensive DVT, first in the left leg in 2020 and now in the right leg. In both instances, no provoking risk factor was found. She denies any injury or recent decreased mobility today, saying she does not sit still for long and is active walking with her walker. Given this, will refer her to hematology for further hypercoagulable workup and recommendations on long-term anticoagulation. Will initiate anticoagulation  today with Eliquis, which she tolerated well in the past. Swelling extends from her foot up to her mid calf, and she is having no pain. Discussed the patient with Dr. Edilia Kelley who does not recommend any vascular surgery intervention at this time given only partial occlusion at the common femoral vein and the patient being asymptomatic above the knee. The patient is also not interested in any procedural intervention.   PLAN: -Start apixaban (Eliquis) 10 mg twice daily for 7 days followed by 5 mg twice daily. -Expected duration of therapy: Indefinite. Therapy started on 05/24/23. -Patient educated on purpose, proper use and potential adverse effects of apixaban (Eliquis). -Discussed importance of taking medication around the same time every day. -Advised patient of medications to avoid (NSAIDs, aspirin doses >100 mg daily). -Educated that Tylenol (acetaminophen) is the preferred analgesic to lower the risk of bleeding. -Advised patient to alert all providers of anticoagulation therapy prior to starting a new medication or having a procedure. -Emphasized importance of monitoring for signs and symptoms of bleeding (abnormal bruising, prolonged bleeding, nose bleeds, bleeding from gums, discolored urine, black tarry stools). -Educated patient to present to the ED if emergent signs and symptoms of new thrombosis occur. -Counseled patient to wear compression stockings daily, removing at night.  Follow up: I will coordinate refills with [facility] for delivery per the patient's preference. Follow up in DVT Clinic in 6 weeks to ensure adherence and no problems with medication access. Referral to hematology placed.   Pervis Hocking, PharmD, [place], CPP Deep Vein Thrombosis Clinic Clinical Pharmacist Practitioner Office: [phone]  I have evaluated the patient's chart/imaging and refer this patient to the Clinical Pharmacist Practitioner for medication management. I have reviewed the CPP's documentation and agree  with her assessment and plan. I was immediately available during the visit for questions and collaboration.   Waverly Ferrari, MD

## 2023-05-24 NOTE — Patient Instructions (Signed)
-  Start apixaban (Eliquis) 10 mg twice daily for 7 days followed by 5 mg twice daily. -I will send your refills to our [facility] pharmacy to be delivered to you. They will call you to set up this delivery.  -It is important to take your medication around the same time every day.  -Avoid NSAIDs like ibuprofen (Advil, Motrin) and naproxen (Aleve) as well as aspirin doses over 100 mg daily. -Tylenol (acetaminophen) is the preferred over the counter pain medication to lower the risk of bleeding. -Be sure to alert all of your health care providers that you are taking an anticoagulant prior to starting a new medication or having a procedure. -Monitor for signs and symptoms of bleeding (abnormal bruising, prolonged bleeding, nose bleeds, bleeding from gums, discolored urine, black tarry stools). If you have fallen and hit your head OR if your bleeding is severe or not stopping, seek emergency care.  -Go to the emergency room if emergent signs and symptoms of new clot occur (new or worse swelling and pain in an arm or leg, shortness of breath, chest pain, fast or irregular heartbeats, lightheadedness, dizziness, fainting, coughing up blood) or if you experience a significant color change (pale or blue) in the extremity that has the DVT.  -We recommend you wear compression stockings (20-30 mmHg) as long as you are having swelling or pain. Be sure to purchase the correct size and take them off at night.   Your next visit is on Tuesday, July 23 at 9:00 AM.  [facility] [address] [place], [place], [place] [address] Enter the hospital through Entrance C off [facility] and pull up to the [facility] Vascular Center entrance to the free valet parking.  Check in for your appointment at the [facility] Vascular Center.   If you have any questions or need to reschedule an appointment, please call [phone] [facility].  If you are having an emergency, call 911 or present to the nearest emergency room.    What is a DVT?  -Deep vein thrombosis (DVT) is a condition in which a blood clot forms in a vein of the deep venous system which can occur in the lower leg, thigh, pelvis, arm, or neck. This condition is serious and can be life-threatening if the clot travels to the arteries of the lungs and causing a blockage (pulmonary embolism, PE). A DVT can also damage veins in the leg, which can lead to long-term venous disease, leg pain, swelling, discoloration, and ulcers or sores (post-thrombotic syndrome).  -Treatment may include taking an anticoagulant medication to prevent more clots from forming and the current clot from growing, wearing compression stockings, and/or surgical procedures to remove or dissolve the clot.

## 2023-05-28 DIAGNOSIS — M19019 Primary osteoarthritis, unspecified shoulder: Secondary | ICD-10-CM | POA: Diagnosis not present

## 2023-05-28 DIAGNOSIS — Z86718 Personal history of other venous thrombosis and embolism: Secondary | ICD-10-CM | POA: Diagnosis not present

## 2023-05-28 DIAGNOSIS — Z7901 Long term (current) use of anticoagulants: Secondary | ICD-10-CM | POA: Diagnosis not present

## 2023-05-28 DIAGNOSIS — H9190 Unspecified hearing loss, unspecified ear: Secondary | ICD-10-CM | POA: Diagnosis not present

## 2023-05-28 DIAGNOSIS — H269 Unspecified cataract: Secondary | ICD-10-CM | POA: Diagnosis not present

## 2023-05-28 DIAGNOSIS — E876 Hypokalemia: Secondary | ICD-10-CM | POA: Diagnosis not present

## 2023-05-28 DIAGNOSIS — M170 Bilateral primary osteoarthritis of knee: Secondary | ICD-10-CM | POA: Diagnosis not present

## 2023-06-15 ENCOUNTER — Inpatient Hospital Stay: Payer: Medicare HMO

## 2023-06-15 ENCOUNTER — Inpatient Hospital Stay: Payer: Medicare HMO | Attending: Hematology | Admitting: Hematology

## 2023-06-15 ENCOUNTER — Encounter: Payer: Self-pay | Admitting: Hematology

## 2023-06-15 ENCOUNTER — Telehealth: Payer: Self-pay | Admitting: *Deleted

## 2023-06-15 VITALS — BP 144/66 | HR 66 | Temp 97.5°F | Resp 16 | Wt 168.6 lb

## 2023-06-15 DIAGNOSIS — Z7901 Long term (current) use of anticoagulants: Secondary | ICD-10-CM | POA: Insufficient documentation

## 2023-06-15 DIAGNOSIS — I82411 Acute embolism and thrombosis of right femoral vein: Secondary | ICD-10-CM | POA: Diagnosis not present

## 2023-06-15 DIAGNOSIS — I824Y2 Acute embolism and thrombosis of unspecified deep veins of left proximal lower extremity: Secondary | ICD-10-CM

## 2023-06-15 DIAGNOSIS — Z801 Family history of malignant neoplasm of trachea, bronchus and lung: Secondary | ICD-10-CM | POA: Diagnosis not present

## 2023-06-15 NOTE — Progress Notes (Signed)
[facility]   Telephone:([phone] Fax:([phone]) [phone]   Clinic New Consult Note   Patient Care Team: Lewis Moccasin, MD as PCP - General (Family Medicine) 06/15/2023  CHIEF COMPLAINTS/PURPOSE OF CONSULTATION:  Recurrent DVT  Referring physician: Urgent care  HISTORY OF PRESENTING ILLNESS:  Jenna Kelley 72 y.o. 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  She was referred by urgent care physician.  She presented clinic with her sister.  She came in a wheelchair.  She had her first episode of DVT in left lower extremity in November 2022, it was unprovoked, she presented with leg pain and edema.  She was treated with Eliquis to for 4 months, her symptoms resolved, she has been off Eliquis since then.  She is not physically active, spent a lot of time in chair, her lower extremity slightly inverted, she use a walker to move around.  She presented with right lower extremity edema and pain in mid June, and went to urgent care on May 24, 2023, ultrasound showed acute DVT in right common femoral vein, and a right proximal profunda vein.  She was started on Eliquis, and has been tolerating well without bleeding.  She denies any chest pain or dyspnea.  Her past medical history is otherwise unremarkable, she lives with her sister, no children.  She delivered her hearing.  She denies any weight loss, fever, night sweats, or other new symptoms lately.  She has not had a colonoscopy but has been doing stool test for colon cancer screening.  She has been doing annual mammogram which has been negative.  No history of malignancy.  MEDICAL HISTORY:  Past Medical History:  Diagnosis Date   Cataract    DVT (deep venous thrombosis) (HCC)    left leg   Osteoarthritis    shoulder   Umbilical hernia     SURGICAL HISTORY: Past Surgical History:  Procedure Laterality Date   NO PAST SURGERIES      SOCIAL HISTORY: Social History   Socioeconomic History    Marital status: Widowed    Spouse name: Not on file   Number of children: 0   Years of education: Not on file   Highest education level: Not on file  Occupational History   Occupation: retired  Tobacco Use   Smoking status: Never   Smokeless tobacco: Never  Substance and Sexual Activity   Alcohol use: No   Drug use: Not Currently   Sexual activity: Not Currently  Other Topics Concern   Not on file  Social History Narrative   Not on file   Social Determinants of Health   Financial Resource Strain: Low Risk  (10/09/2019)   Overall Financial Resource Strain (CARDIA)    Difficulty of Paying Living Expenses: Not very hard  Food Insecurity: No Food Insecurity (07/28/2021)   Hunger Vital Sign    Worried About Running Out of Food in the Last Year: Never true    Ran Out of Food in the Last Year: Never true  Transportation Needs: No Transportation Needs (07/28/2021)   PRAPARE - Administrator, Civil Service (Medical): No    Lack of Transportation (Non-Medical): No  Physical Activity: Not on file  Stress: Not on file  Social Connections: Not on file  Intimate Partner Violence: Not on file    FAMILY HISTORY: Family History  Problem Relation Age of Onset   Diabetes Mother    Diabetes Sister    Hypertension Sister  Lung cancer Sister    Breast cancer Neg Hx     ALLERGIES:  has No Known Allergies.  MEDICATIONS:  Current Outpatient Medications  Medication Sig Dispense Refill   APIXABAN (ELIQUIS) VTE STARTER PACK (10MG  AND 5MG ) Take as directed on package: start with two-5mg  tablets twice daily for 7 days. On day 8, switch to one-5mg  tablet twice daily. 74 each 0   Multiple Vitamin (MULTIVITAMIN WITH MINERALS) TABS tablet Take 1 tablet by mouth daily.     sertraline (ZOLOFT) 100 MG tablet Take 100 mg by mouth every morning.     No current facility-administered medications for this visit.    REVIEW OF SYSTEMS:   Constitutional: Denies fevers, chills or abnormal  night sweats Eyes: Denies blurriness of vision, double vision or watery eyes Ears, nose, mouth, throat, and face: Denies mucositis or sore throat Respiratory: Denies cough, dyspnea or wheezes Cardiovascular: Denies palpitation, chest discomfort or lower extremity swelling Gastrointestinal:  Denies nausea, heartburn or change in bowel habits Skin: Denies abnormal skin rashes Lymphatics: Denies new lymphadenopathy or easy bruising Neurological:Denies numbness, tingling or new weaknesses Behavioral/Psych: Mood is stable, no new changes  All other systems were reviewed with the patient and are negative.  PHYSICAL EXAMINATION: ECOG PERFORMANCE STATUS: 2 - Symptomatic, <50% confined to bed  Vitals:   06/15/23 0923  BP: (!) 144/66  Pulse: 66  Resp: 16  Temp: (!) 97.5 F (36.4 C)  SpO2: 100%   Filed Weights   06/15/23 0923  Weight: 168 lb 9.6 oz (76.5 kg)    GENERAL:alert, no distress and comfortable SKIN: skin color, texture, turgor are normal, no rashes or significant lesions EYES: normal, conjunctiva are pink and non-injected, sclera clear OROPHARYNX:no exudate, no erythema and lips, buccal mucosa, and tongue normal  NECK: supple, thyroid normal size, non-tender, without nodularity LYMPH:  no palpable lymphadenopathy in the cervical, axillary or inguinal LUNGS: clear to auscultation and percussion with normal breathing effort HEART: regular rate & rhythm and no murmurs  ABDOMEN:abdomen soft, non-tender and normal bowel sounds Musculoskeletal:no cyanosis of digits and no clubbing, (+) pitting edema in RLE up to knee  PSYCH: alert & oriented x 3 with fluent speech NEURO: no focal motor/sensory deficits  LABORATORY DATA:  I have reviewed the data as listed No results found for this or any previous visit (from the past 2160 hour(s)).  RADIOGRAPHIC STUDIES: I have personally reviewed the radiological images as listed and agreed with the findings in the report. LE  VENOUS  Result Date: 05/24/2023  Lower Venous DVT Study Patient Name:  Jenna Kelley  Date of Exam:   05/24/2023 Medical Rec #: [id number]            Accession #:    [id number] Date of Birth: 12/18/1950           Patient Gender: F Patient Age:   18 years Exam Location:  [facility] Procedure:      VAS [place] LOWER EXTREMITY VENOUS (DVT) Referring Phys: [place] GARRISON --------------------------------------------------------------------------------  Indications: Swelling.  Risk Factors: Patient has limited mobility DVT Hx DVT 11/20 left CFV Patient complains of swelling in her right foot after having to walk a lot at a family reunion. Anticoagulation: Eliquis today. Performing Technologist: Jake Seats RDMS, RVT, RDCS  Examination Guidelines: A complete evaluation includes B-mode imaging, spectral Doppler, color Doppler, and power Doppler as needed of all accessible portions of each vessel. Bilateral testing is considered an integral part of a complete examination. Limited examinations for reoccurring indications may  be performed as noted. The reflux portion of the exam is performed with the patient in reverse Trendelenburg.  +---------+---------------+---------+-----------+----------+--------------+ RIGHT    CompressibilityPhasicitySpontaneityPropertiesThrombus Aging +---------+---------------+---------+-----------+----------+--------------+ CFV      Partial        Yes      Yes                  Acute          +---------+---------------+---------+-----------+----------+--------------+ SFJ      Full           Yes      Yes                                 +---------+---------------+---------+-----------+----------+--------------+ FV Prox  Partial        Yes      Yes                  Acute          +---------+---------------+---------+-----------+----------+--------------+ FV Mid   Full           Yes      Yes                                  +---------+---------------+---------+-----------+----------+--------------+ FV DistalFull           Yes      Yes                                 +---------+---------------+---------+-----------+----------+--------------+ PFV      Partial        Yes      Yes                  Acute          +---------+---------------+---------+-----------+----------+--------------+ POP      Full           Yes      Yes                                 +---------+---------------+---------+-----------+----------+--------------+ PTV      Full           Yes      Yes                                 +---------+---------------+---------+-----------+----------+--------------+ Gastroc  Full           Yes      Yes                                 +---------+---------------+---------+-----------+----------+--------------+ GSV      Full           Yes      Yes                                 +---------+---------------+---------+-----------+----------+--------------+ Proximal and distal right EIV are patent . IVC was patent.  +----+---------------+---------+-----------+----------+--------------+ LEFTCompressibilityPhasicitySpontaneityPropertiesThrombus Aging +----+---------------+---------+-----------+----------+--------------+ CFV Full           Yes      Yes                                 +----+---------------+---------+-----------+----------+--------------+  SFJ Full           Yes      Yes                                 +----+---------------+---------+-----------+----------+--------------+     Summary: RIGHT: - Findings consistent with acute deep vein thrombosis involving the right common femoral vein, right femoral vein, and right proximal profunda vein.  LEFT: - No evidence of common femoral vein obstruction.  *See table(s) above for measurements and observations. Electronically signed by Coral Else MD on 05/24/2023 at 11:57:46 AM.    Final     ASSESSMENT:  Recurrent  DVT/PE  PLAN:  I reviewed with the patient about the plan for care for recurrent LE DVT.  This last episode of blood clot appeared to be unprovoked. We discussed about the pros and cons about testing for thrombophilia disorder. her current anticoagulation therapy will interfere with some the tests and it is not possible to interpret the test results.  Taking her off the anticoagulation therapy to do the tests may precipitate another thrombotic event. I do not see a reason to order excessive testing to screen for thrombophilia disorder as it would not change our management.  The goal of anticoagulation therapy is for life due to recurrent unprovoked DVT.   We discussed about various options of anticoagulation therapies including warfarin, low molecular weight heparin such as Lovenox or newer agents such as Rivaroxaban. Some of the risks and benefits discussed including costs involved, the need for monitoring, risks of life-threatening bleeding/hospitalization, reversibility of each agent in the event of bleeding or overdose, safety profile of each drug and taking into account other social issues such as ease of administration of medications, etc. Ultimately, we have made an informed decision for the patient to continue her treatment with Eliquis, she is tolerating well.  I recommend the patient to use elastic compression stockings at 20-30 mmHg to reduce risks of chronic thrombophlebitis.  Finally, at the end of our consultation today, I reinforced the importance of preventive strategies such as avoiding hormonal supplement, avoiding cigarette smoking, keeping up-to-date with screening programs for early cancer detection, frequent ambulation for long distance travel and aggressive DVT prophylaxis in all surgical settings.  She is scheduled to see her primary care physician next months with lab.  I recommend her to monitor lab current CBC, CMP every 6 months.  I have not made a return appointment for  the patient to come back. I would be happy to assist in perioperative DVT management in the future should she need any interruption of her anticoagulation therapy for elective procedures.  Recommendations -I recommend lifelong anticoagulation, she will continue Eliquis.  Will follow-up with her primary care physician for labs and refills -I will see her as needed    Malachy Mood, MD 06/15/2023 9:46 AM

## 2023-06-15 NOTE — Telephone Encounter (Signed)
Per 06/15/23 los -  F/u as needed

## 2023-06-18 ENCOUNTER — Other Ambulatory Visit: Payer: Self-pay

## 2023-06-18 ENCOUNTER — Other Ambulatory Visit (HOSPITAL_COMMUNITY): Payer: Self-pay

## 2023-06-18 ENCOUNTER — Other Ambulatory Visit (HOSPITAL_COMMUNITY): Payer: Self-pay | Admitting: Student-PharmD

## 2023-06-18 DIAGNOSIS — I82411 Acute embolism and thrombosis of right femoral vein: Secondary | ICD-10-CM

## 2023-06-18 MED ORDER — APIXABAN 5 MG PO TABS
5.0000 mg | ORAL_TABLET | Freq: Two times a day (BID) | ORAL | 1 refills | Status: AC
  Filled 2023-06-18: qty 180, 90d supply, fill #0

## 2023-06-20 ENCOUNTER — Other Ambulatory Visit: Payer: Self-pay

## 2023-06-20 ENCOUNTER — Other Ambulatory Visit (HOSPITAL_COMMUNITY): Payer: Self-pay

## 2023-06-20 DIAGNOSIS — H269 Unspecified cataract: Secondary | ICD-10-CM | POA: Diagnosis not present

## 2023-06-20 DIAGNOSIS — H9190 Unspecified hearing loss, unspecified ear: Secondary | ICD-10-CM | POA: Diagnosis not present

## 2023-06-20 DIAGNOSIS — E876 Hypokalemia: Secondary | ICD-10-CM | POA: Diagnosis not present

## 2023-06-20 DIAGNOSIS — Z86718 Personal history of other venous thrombosis and embolism: Secondary | ICD-10-CM | POA: Diagnosis not present

## 2023-06-20 DIAGNOSIS — M19019 Primary osteoarthritis, unspecified shoulder: Secondary | ICD-10-CM | POA: Diagnosis not present

## 2023-06-20 DIAGNOSIS — Z7901 Long term (current) use of anticoagulants: Secondary | ICD-10-CM | POA: Diagnosis not present

## 2023-06-20 DIAGNOSIS — M170 Bilateral primary osteoarthritis of knee: Secondary | ICD-10-CM | POA: Diagnosis not present

## 2023-06-25 ENCOUNTER — Ambulatory Visit (HOSPITAL_COMMUNITY)
Admission: RE | Admit: 2023-06-25 | Discharge: 2023-06-25 | Disposition: A | Discharge status: Home / Self Care | Payer: Medicare HMO | Source: Ambulatory Visit | Attending: Vascular Surgery | Admitting: Vascular Surgery

## 2023-06-25 ENCOUNTER — Encounter (HOSPITAL_COMMUNITY): Payer: Self-pay

## 2023-06-25 VITALS — BP 119/57 | HR 58

## 2023-06-25 DIAGNOSIS — I82411 Acute embolism and thrombosis of right femoral vein: Secondary | ICD-10-CM

## 2023-06-25 NOTE — Patient Instructions (Signed)
-  Continue apixaban (Eliquis) 5 mg twice daily. -Your refills have been sent to [facility] for delivery. You may need to call the pharmacy to ask them to fill this when you start to run low on your current supply.  -Follow up in DVT Clinic as needed.  -It is important to take your medication around the same time every day.  -Avoid NSAIDs like ibuprofen (Advil, Motrin) and naproxen (Aleve) as well as aspirin doses over 100 mg daily. -Tylenol (acetaminophen) is the preferred over the counter pain medication to lower the risk of bleeding. -Be sure to alert all of your health care providers that you are taking an anticoagulant prior to starting a new medication or having a procedure. -Monitor for signs and symptoms of bleeding (abnormal bruising, prolonged bleeding, nose bleeds, bleeding from gums, discolored urine, black tarry stools). If you have fallen and hit your head OR if your bleeding is severe or not stopping, seek emergency care.  -Go to the emergency room if emergent signs and symptoms of new clot occur (new or worse swelling and pain in an arm or leg, shortness of breath, chest pain, fast or irregular heartbeats, lightheadedness, dizziness, fainting, coughing up blood) or if you experience a significant color change (pale or blue) in the extremity that has the DVT.  -We recommend you wear compression stockings (20-30 mmHg) as long as you are having swelling or pain. Be sure to purchase the correct size and take them off at night. Continue elevating your legs as well.   If you have any questions or need to reschedule an appointment, please call [phone] [facility].  If you are having an emergency, call 911 or present to the nearest emergency room.   What is a DVT?  -Deep vein thrombosis (DVT) is a condition in which a blood clot forms in a vein of the deep venous system which can occur in the lower leg, thigh, pelvis, arm, or neck. This condition is serious  and can be life-threatening if the clot travels to the arteries of the lungs and causing a blockage (pulmonary embolism, PE). A DVT can also damage veins in the leg, which can lead to long-term venous disease, leg pain, swelling, discoloration, and ulcers or sores (post-thrombotic syndrome).  -Treatment may include taking an anticoagulant medication to prevent more clots from forming and the current clot from growing, wearing compression stockings, and/or surgical procedures to remove or dissolve the clot.

## 2023-06-25 NOTE — Progress Notes (Addendum)
DVT Clinic Note  Name: Jenna Kelley     MRN: [id number]     DOB: Sep 07, 1951     Sex: female  PCP: Lewis Moccasin, MD  Today's Visit: Visit Information: Follow Up Visit  Referred to DVT Clinic by: [place] Garrison, FNP [facility])   Referred to CPP by: Dr. Edilia Bo Reason for referral:  Chief Complaint  Patient presents with   Med Management - DVT   HISTORY OF PRESENT ILLNESS: Jenna Kelley is a 72 y.o. 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 Patient had presented to [facility] for RLE swelling x 3 weeks. Ultrasound showed acute DVT involving the right common femoral vein. At that time the patient was having no pain, only swelling. Last seen in DVT Clinic 05/24/23 at which time Eliquis was started, and the patient was referred to hematology for recurrent unprovoked DVT. I discussed the patient with Dr. Edilia Bo who recommended no vascular intervention since she was asymptomatic above the knee and there was only partial occlusion in the common femoral vein. She saw Dr. Mosetta Putt on 06/15/23 who did not recommend hypercoagulable workup and recommended lifelong anticoagulation.   Today patient presents in a wheelchair and is accompanied by her sister, [name]. She reports that she is still having no pain, and her swelling has improved from last month. Denies abnormal bleeding or bruising. Denies missed doses of Eliquis. She takes it at 6am and 6pm. She elevates her legs daily but has not started wearing compression stockings. She plans to purchase some tomorrow with her sister. She is active at home and running errands using her walker. [facility] She received her Eliquis refills in the mail from [facility] without issue.   Positive Thrombotic Risk Factors: Previous VTE, Older Age Bleeding Risk Factors: Age >65 years, Anticoagulant therapy  Negative  Thrombotic Risk Factors: Recent surgery (within 3 months), Recent trauma (within 3 months), Recent admission to hospital with acute illness (within 3 months), Paralysis, paresis, or recent plaster cast immobilization of lower extremity, Central venous catheterization, Bed rest >72 hours within 3 months, Sedentary journey lasting >8 hours within 4 weeks, Pregnancy, Within 6 weeks postpartum, Recent cesarean section (within 3 months), Estrogen therapy, Testosterone therapy, Erythropoiesis-stimulating agent, Recent COVID diagnosis (within 3 months), Active cancer, Non-malignant, chronic inflammatory condition, Known thrombophilic condition, Smoking, Obesity  Rx Insurance Coverage: Medicare + Extra Help Rx Affordability: Eliquis is $11.20 for a 30 or 90 day supply  Preferred Pharmacy: Filled Eliquis starter pack during her last visit at [facility]. Refills have been sent to [facility] for home delivery per the patient's preference.   Past Medical History:  Diagnosis Date   Cataract    DVT (deep venous thrombosis) (HCC)    left leg   Osteoarthritis    shoulder   Umbilical hernia     Past Surgical History:  Procedure Laterality Date   NO PAST SURGERIES      Social History   Socioeconomic History   Marital status: Widowed    Spouse name: Not on file   Number of children: 0   Years of education: Not on file   Highest education level: Not on file  Occupational History   Occupation: retired  Tobacco Use   Smoking status: Never   Smokeless tobacco: Never  Substance and Sexual Activity   Alcohol use: No  Drug use: Not Currently   Sexual activity: Not Currently  Other Topics Concern   Not on file  Social History Narrative   Not on file   Social Determinants of Health   Financial Resource Strain: Low Risk  (10/09/2019)   Overall Financial Resource Strain (CARDIA)    Difficulty of Paying Living Expenses: Not very hard  Food Insecurity: No Food Insecurity  (06/15/2023)   Hunger Vital Sign    Worried About Running Out of Food in the Last Year: Never true    Ran Out of Food in the Last Year: Never true  Transportation Needs: No Transportation Needs (06/15/2023)   PRAPARE - Administrator, Civil Service (Medical): No    Lack of Transportation (Non-Medical): No  Physical Activity: Not on file  Stress: Not on file  Social Connections: Not on file  Intimate Partner Violence: Not At Risk (06/15/2023)   Humiliation, Afraid, Rape, and Kick questionnaire    Fear of Current or Ex-Partner: No    Emotionally Abused: No    Physically Abused: No    Sexually Abused: No    Family History  Problem Relation Age of Onset   Diabetes Mother    Diabetes Sister    Hypertension Sister    Lung cancer Sister    Breast cancer Neg Hx     Allergies as of 06/25/2023   (No Known Allergies)    Current Outpatient Medications on File Prior to Encounter  Medication Sig Dispense Refill   apixaban (ELIQUIS) 5 MG TABS tablet Take 1 tablet (5 mg total) by mouth 2 (two) times daily. 180 tablet 1   Multiple Vitamin (MULTIVITAMIN WITH MINERALS) TABS tablet Take 1 tablet by mouth daily.     sertraline (ZOLOFT) 100 MG tablet Take 100 mg by mouth every morning.     No current facility-administered medications on file prior to encounter.   REVIEW OF SYSTEMS:  Review of Systems  Respiratory:  Negative for shortness of breath.   Cardiovascular:  Positive for leg swelling. Negative for chest pain and palpitations.  Musculoskeletal:  Negative for myalgias.  Neurological:  Negative for dizziness and tingling.   PHYSICAL EXAMINATION:  Vitals:   06/25/23 0906  BP: (!) 119/57  Pulse: (!) 58  SpO2: 100%   Physical Exam Vitals reviewed.  Cardiovascular:     Rate and Rhythm: Normal rate.  Pulmonary:     Effort: Pulmonary effort is normal.  Musculoskeletal:        General: No tenderness.     Right lower leg: Edema (1+ (ankle to below level of the calf))  present.     Left lower leg: No edema.  Skin:    Findings: No bruising or erythema.  Psychiatric:        Mood and Affect: Mood normal.        Behavior: Behavior normal.        Thought Content: Thought content normal.   Villalta Score for Post-Thrombotic Syndrome: Pain: Absent Cramps: Absent Heaviness: Absent Paresthesia: Absent Pruritus: Absent Pretibial Edema: Mild Skin Induration: Absent Hyperpigmentation: Absent Redness: Absent Venous Ectasia: Absent Pain on calf compression: Absent Villalta Preliminary Score: 1 Is venous ulcer present?: No If venous ulcer is present and score is <15, then 15 points total are assigned: Absent Villalta Total Score: 1  LABS:  CBC     Component Value Date/Time   WBC 3.9 (L) 09/14/2022 1900   RBC 4.39 09/14/2022 1900   HGB 12.6 09/14/2022 1900   HCT  40.4 09/14/2022 1900   PLT 189 09/14/2022 1900   MCV 92.0 09/14/2022 1900   MCH 28.7 09/14/2022 1900   MCHC 31.2 09/14/2022 1900   RDW 13.8 09/14/2022 1900   LYMPHSABS 1.3 09/14/2022 1900   MONOABS 0.4 09/14/2022 1900   EOSABS 0.1 09/14/2022 1900   BASOSABS 0.0 09/14/2022 1900    Hepatic Function      Component Value Date/Time   PROT 5.0 (L) 10/06/2019 0506   ALBUMIN 1.8 (L) 10/06/2019 0506   AST 42 (H) 10/06/2019 0506   ALT 38 10/06/2019 0506   ALKPHOS 55 10/06/2019 0506   BILITOT 0.6 10/06/2019 0506    Renal Function   Lab Results  Component Value Date   CREATININE 0.74 09/14/2022   CREATININE 0.58 10/07/2019   CREATININE 0.71 10/06/2019    CrCl cannot be calculated (Patient's most recent lab result is older than the maximum 21 days allowed.).   More recent labs available in the patient's chart via [facility] are without any concerns:  02/12/23: Hgb 13.2, Plt 184K, serum creatinine 0.64, eGFR 94, AST 20, ALT 13  VVS Vascular Lab Studies:  05/24/23 VAS [place] LOWER EXTREMITY VENOUS (DVT)RIGHT Summary:  RIGHT:  - Findings consistent with acute deep vein thrombosis involving the  right  common femoral vein, right femoral vein, and right proximal profunda vein.    LEFT:  - No evidence of common femoral vein obstruction.   ASSESSMENT: Location of DVT: Right common femoral vein, Right femoral vein Cause of DVT: unprovoked - this is the patient's second extensive DVT, first in the left leg in 2020 and now in the right leg. Both were unprovoked. She has established with hematology who recommends lifelong anticoagulation for recurrent unprovoked DVT. The patient has no medication access or adherence issues. She is tolerating Eliquis well without adverse effects. She remains in no pain, and her swelling has significantly improved from last month. Continue plan for indefinite Eliquis and continued elevation. Encouraged her to start compression stockings soon to further improve her swelling and decrease risk of post-thrombotic syndrome.   PLAN: -Continue apixaban (Eliquis) 5 mg twice daily. -Expected duration of therapy: Indefinite. Therapy started on 05/24/23. -Patient educated on purpose, proper use and potential adverse effects of apixaban (Eliquis). -Discussed importance of taking medication around the same time every day. -Advised patient of medications to avoid (NSAIDs, aspirin doses >100 mg daily). -Educated that Tylenol (acetaminophen) is the preferred analgesic to lower the risk of bleeding. -Advised patient to alert all providers of anticoagulation therapy prior to starting a new medication or having a procedure. -Emphasized importance of monitoring for signs and symptoms of bleeding (abnormal bruising, prolonged bleeding, nose bleeds, bleeding from gums, discolored urine, black tarry stools). -Educated patient to present to the ED if emergent signs and symptoms of new thrombosis occur. -Counseled patient to wear compression stockings daily, removing at night. Continue elevation as well.   Follow up: With PCP next month as planned. DVT Clinic as needed.   Pervis Hocking, PharmD, [place], CPP Deep Vein Thrombosis Clinic Clinical Pharmacist Practitioner Office: [phone]  I have evaluated the patient's chart/imaging and refer this patient to the Clinical Pharmacist Practitioner for medication management. I have reviewed the CPP's documentation and agree with her assessment and plan. I was immediately available during the visit for questions and collaboration.   Waverly Ferrari, MD

## 2023-07-31 DIAGNOSIS — D72819 Decreased white blood cell count, unspecified: Secondary | ICD-10-CM | POA: Diagnosis not present

## 2023-07-31 DIAGNOSIS — E785 Hyperlipidemia, unspecified: Secondary | ICD-10-CM | POA: Diagnosis not present

## 2023-07-31 DIAGNOSIS — Z114 Encounter for screening for human immunodeficiency virus [HIV]: Secondary | ICD-10-CM | POA: Diagnosis not present

## 2023-07-31 DIAGNOSIS — Z13220 Encounter for screening for lipoid disorders: Secondary | ICD-10-CM | POA: Diagnosis not present

## 2023-07-31 DIAGNOSIS — Z6820 Body mass index (BMI) 20.0-20.9, adult: Secondary | ICD-10-CM | POA: Diagnosis not present

## 2023-07-31 DIAGNOSIS — Z0000 Encounter for general adult medical examination without abnormal findings: Secondary | ICD-10-CM | POA: Diagnosis not present

## 2023-08-06 DIAGNOSIS — I6529 Occlusion and stenosis of unspecified carotid artery: Secondary | ICD-10-CM | POA: Diagnosis not present

## 2023-08-06 DIAGNOSIS — I10 Essential (primary) hypertension: Secondary | ICD-10-CM | POA: Diagnosis not present

## 2023-08-06 DIAGNOSIS — Z789 Other specified health status: Secondary | ICD-10-CM | POA: Diagnosis not present

## 2023-08-06 DIAGNOSIS — Z23 Encounter for immunization: Secondary | ICD-10-CM | POA: Diagnosis not present

## 2023-08-06 DIAGNOSIS — Z0000 Encounter for general adult medical examination without abnormal findings: Secondary | ICD-10-CM | POA: Diagnosis not present

## 2023-08-06 DIAGNOSIS — E782 Mixed hyperlipidemia: Secondary | ICD-10-CM | POA: Diagnosis not present

## 2023-08-06 DIAGNOSIS — I82409 Acute embolism and thrombosis of unspecified deep veins of unspecified lower extremity: Secondary | ICD-10-CM | POA: Diagnosis not present

## 2023-08-06 DIAGNOSIS — H6121 Impacted cerumen, right ear: Secondary | ICD-10-CM | POA: Diagnosis not present

## 2023-08-06 DIAGNOSIS — Z1211 Encounter for screening for malignant neoplasm of colon: Secondary | ICD-10-CM | POA: Diagnosis not present

## 2023-08-06 DIAGNOSIS — M810 Age-related osteoporosis without current pathological fracture: Secondary | ICD-10-CM | POA: Diagnosis not present

## 2023-08-07 ENCOUNTER — Other Ambulatory Visit (HOSPITAL_COMMUNITY): Payer: Self-pay

## 2023-10-07 DIAGNOSIS — E785 Hyperlipidemia, unspecified: Secondary | ICD-10-CM | POA: Diagnosis not present

## 2023-10-07 DIAGNOSIS — R5383 Other fatigue: Secondary | ICD-10-CM | POA: Diagnosis not present

## 2023-10-10 DIAGNOSIS — F331 Major depressive disorder, recurrent, moderate: Secondary | ICD-10-CM | POA: Diagnosis not present

## 2023-10-10 DIAGNOSIS — E782 Mixed hyperlipidemia: Secondary | ICD-10-CM | POA: Diagnosis not present

## 2023-10-10 DIAGNOSIS — I82409 Acute embolism and thrombosis of unspecified deep veins of unspecified lower extremity: Secondary | ICD-10-CM | POA: Diagnosis not present

## 2023-10-10 DIAGNOSIS — G4700 Insomnia, unspecified: Secondary | ICD-10-CM | POA: Diagnosis not present

## 2023-10-10 DIAGNOSIS — F411 Generalized anxiety disorder: Secondary | ICD-10-CM | POA: Diagnosis not present

## 2023-10-10 DIAGNOSIS — Z789 Other specified health status: Secondary | ICD-10-CM | POA: Diagnosis not present

## 2023-10-10 DIAGNOSIS — R030 Elevated blood-pressure reading, without diagnosis of hypertension: Secondary | ICD-10-CM | POA: Diagnosis not present

## 2023-10-10 DIAGNOSIS — I10 Essential (primary) hypertension: Secondary | ICD-10-CM | POA: Diagnosis not present

## 2023-10-19 IMAGING — MG MM DIGITAL SCREENING BILAT W/ TOMO AND CAD
6 of 10 series · 6 of 30 positions shown · non-contrast
Comparison: Previous exam(s).

CLINICAL DATA: Screening.

EXAM:
DIGITAL SCREENING BILATERAL MAMMOGRAM WITH TOMOSYNTHESIS AND CAD
TECHNIQUE: Bilateral screening digital craniocaudal and mediolateral oblique
mammograms were obtained. Bilateral screening digital breast
tomosynthesis was performed. The images were evaluated with
computer-aided detection.

[R MLO synth-2D]
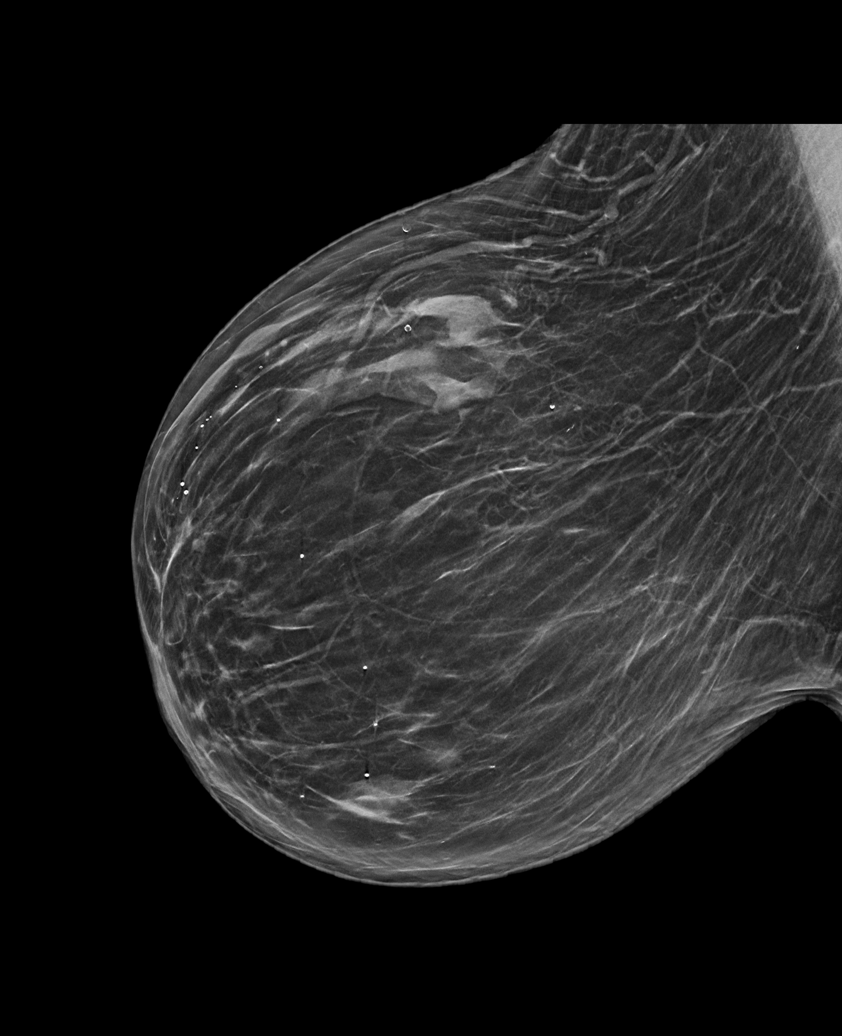

[L MLO synth-2D (1 of 2)]
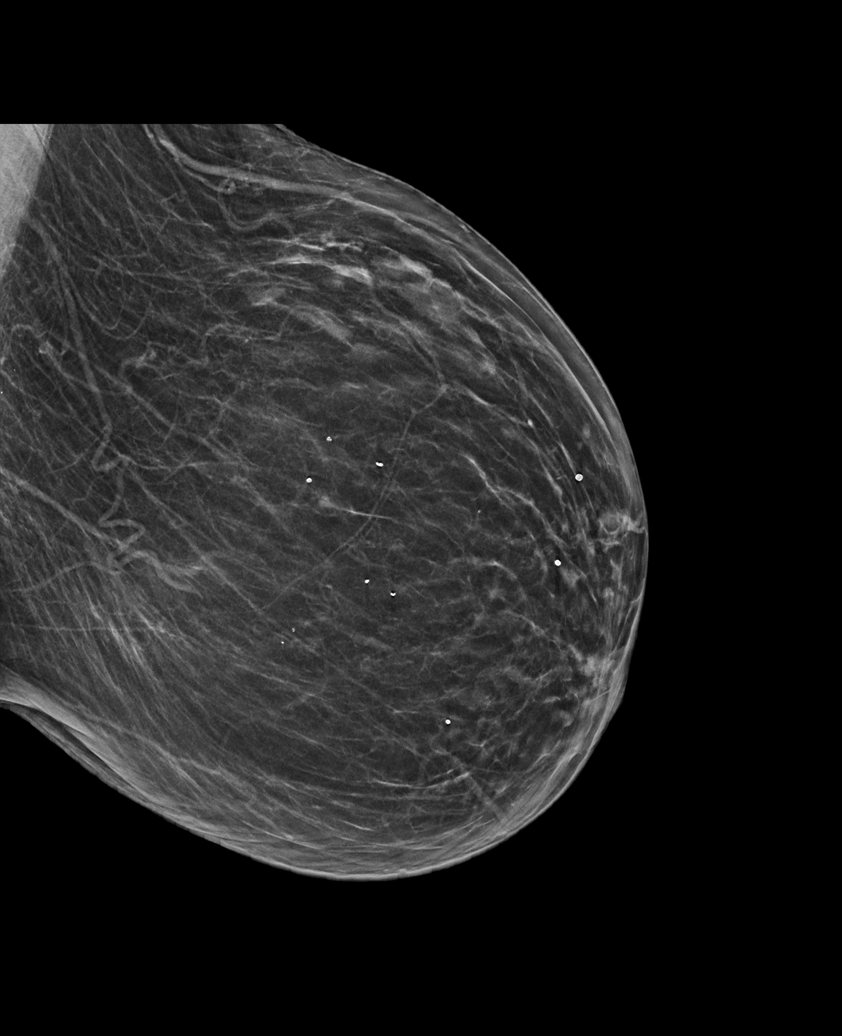

[L MLO synth-2D (2 of 2)]
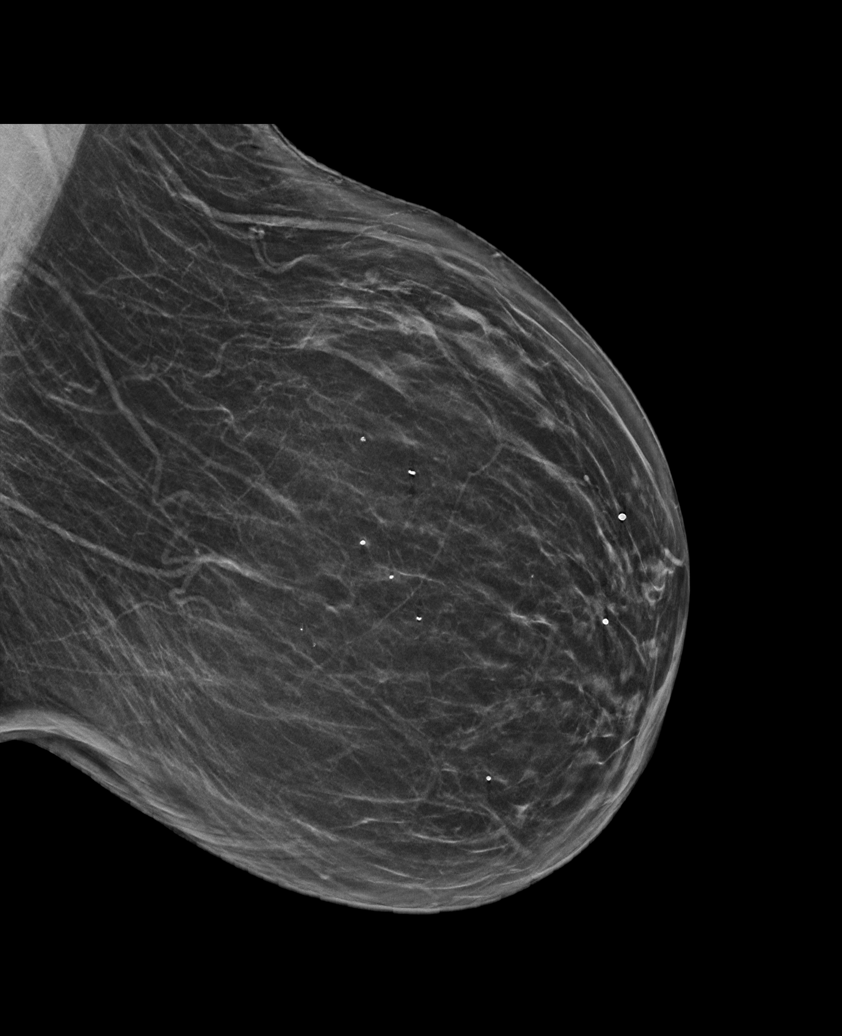

[R CC synth-2D]
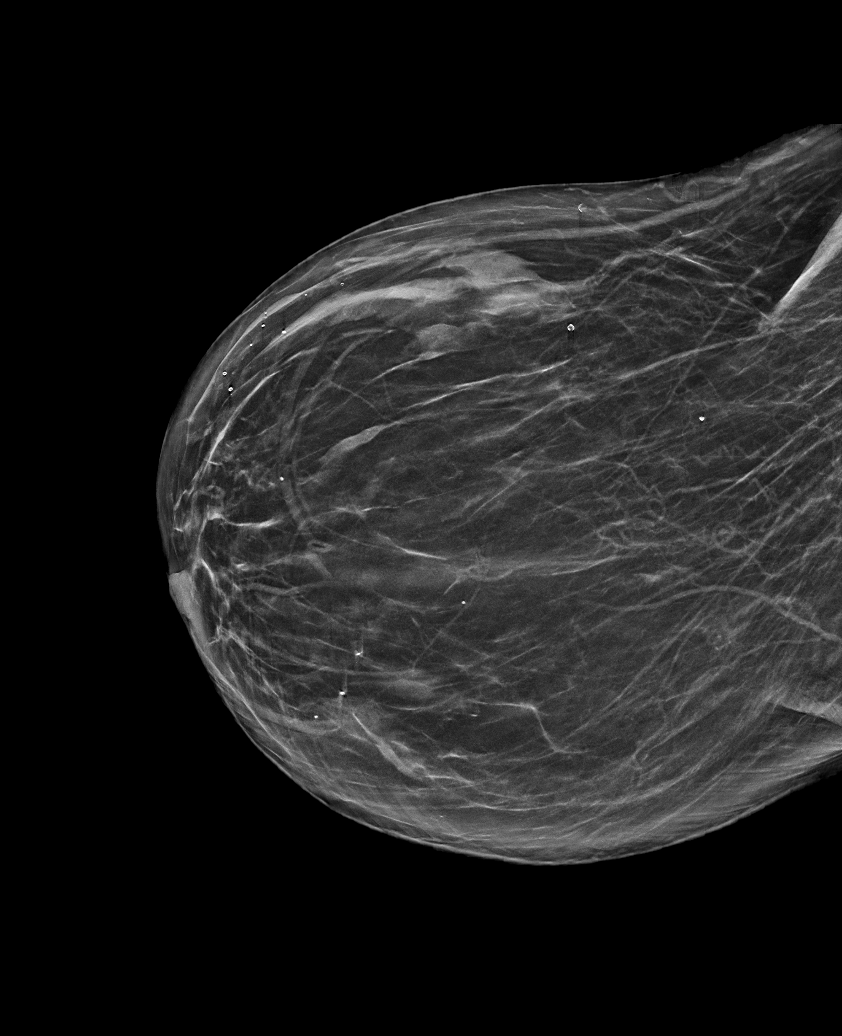

[L CC synth-2D]
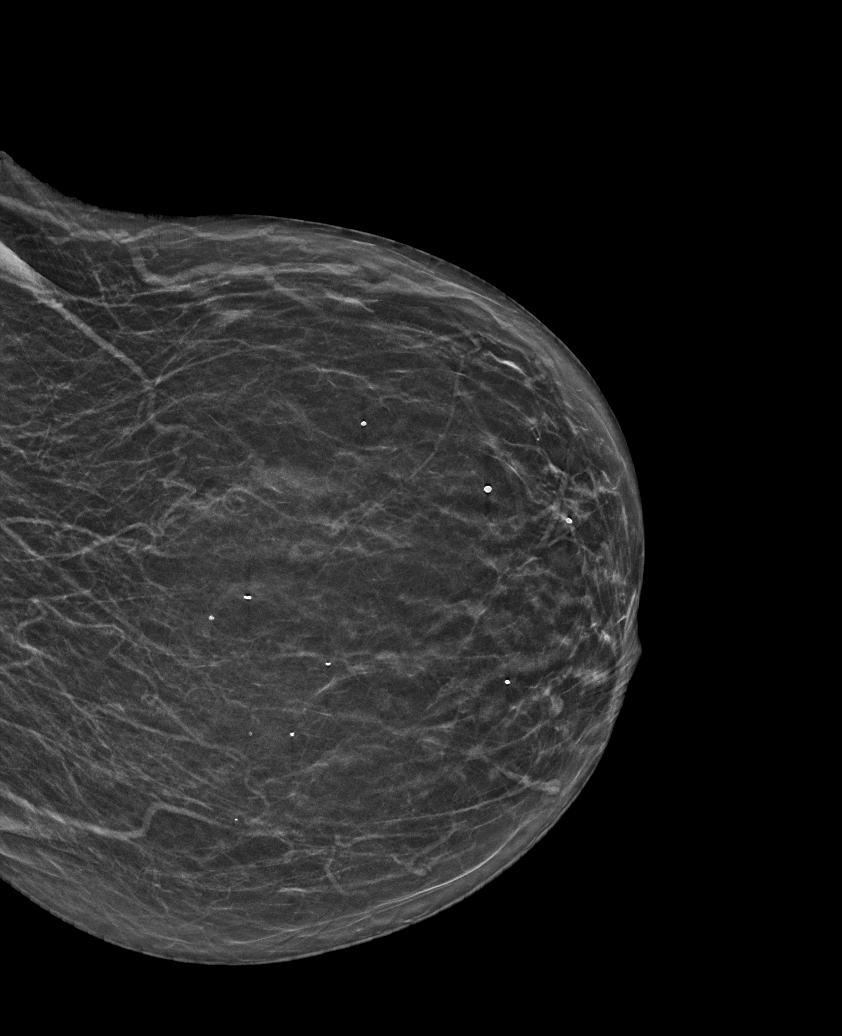

[R MLO tomo · tomo slice 31/60.0]
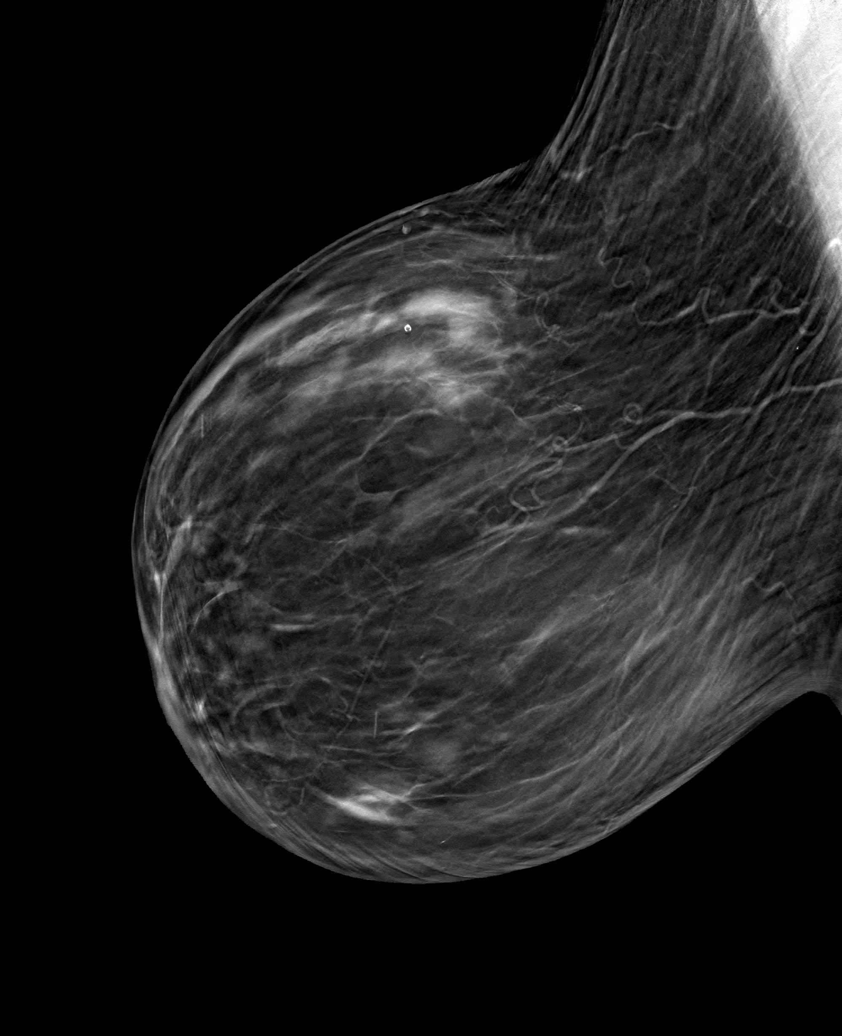

[6 of 30 positions shown; findings below may reference images not displayed]

ACR Breast Density Category b: There are scattered areas of
fibroglandular density.
FINDINGS: There are no findings suspicious for malignancy.
IMPRESSION: No mammographic evidence of malignancy. A result letter of this
screening mammogram will be mailed directly to the patient.

RECOMMENDATION:
Screening mammogram in one year. (Code:51-O-LD2)

BI-RADS CATEGORY  1: Negative.

## 2023-11-18 DIAGNOSIS — R5383 Other fatigue: Secondary | ICD-10-CM | POA: Diagnosis not present

## 2023-11-18 DIAGNOSIS — E559 Vitamin D deficiency, unspecified: Secondary | ICD-10-CM | POA: Diagnosis not present

## 2023-11-21 DIAGNOSIS — M810 Age-related osteoporosis without current pathological fracture: Secondary | ICD-10-CM | POA: Diagnosis not present

## 2023-11-21 DIAGNOSIS — F331 Major depressive disorder, recurrent, moderate: Secondary | ICD-10-CM | POA: Diagnosis not present

## 2023-11-21 DIAGNOSIS — F411 Generalized anxiety disorder: Secondary | ICD-10-CM | POA: Diagnosis not present

## 2023-11-21 DIAGNOSIS — E559 Vitamin D deficiency, unspecified: Secondary | ICD-10-CM | POA: Diagnosis not present

## 2023-11-21 DIAGNOSIS — G4700 Insomnia, unspecified: Secondary | ICD-10-CM | POA: Diagnosis not present

## 2023-11-28 DIAGNOSIS — Z0000 Encounter for general adult medical examination without abnormal findings: Secondary | ICD-10-CM | POA: Diagnosis not present

## 2023-11-28 DIAGNOSIS — Z1339 Encounter for screening examination for other mental health and behavioral disorders: Secondary | ICD-10-CM | POA: Diagnosis not present

## 2023-11-28 DIAGNOSIS — Z1331 Encounter for screening for depression: Secondary | ICD-10-CM | POA: Diagnosis not present

## 2023-12-20 ENCOUNTER — Ambulatory Visit (HOSPITAL_BASED_OUTPATIENT_CLINIC_OR_DEPARTMENT_OTHER): Payer: Medicare HMO | Admitting: Psychiatry

## 2023-12-20 ENCOUNTER — Encounter (HOSPITAL_COMMUNITY): Payer: Self-pay | Admitting: Psychiatry

## 2023-12-20 ENCOUNTER — Other Ambulatory Visit: Payer: Self-pay

## 2023-12-20 VITALS — BP 127/75 | HR 64 | Ht 65.0 in | Wt 167.0 lb

## 2023-12-20 DIAGNOSIS — F4322 Adjustment disorder with anxiety: Secondary | ICD-10-CM

## 2023-12-20 MED ORDER — BUSPIRONE HCL 10 MG PO TABS
10.0000 mg | ORAL_TABLET | Freq: Three times a day (TID) | ORAL | 5 refills | Status: DC
Start: 2023-12-20 — End: 2024-01-28

## 2023-12-20 MED ORDER — SERTRALINE HCL 50 MG PO TABS: 50.0000 mg | ORAL_TABLET | Freq: Every morning | ORAL | 0 refills | Status: DC

## 2023-12-20 NOTE — Progress Notes (Signed)
Psychiatric Initial Adult Assessment   Patient Identification: Jenna Kelley MRN:  [id number] Date of Evaluation:  12/20/2023 Referral Source: Dr. Maryelizabeth Rowan Chief Complaint: Anxiety Visit Diagnosis:   History of Present Illness:  This patient is a 73 year old African-American female who is a widow and was seen with her sister Jenna Kelley.  The patient lives with Jenna Kelley and her brother Jenna Kelley.  The patient is here because of anxiety and some questions of visual hallucinations.  The patient has had no recent new psychosocial stressors.  She enjoys living with her sister and brother and claims she is very active with them.  The patient's husband died 20 years ago.  The patient denies daily depression.  She is eating well.  Her sleeping is a bit erratic.  Her energy level was good.  She denies worthlessness.  The patient enjoys life.  She watches TV-watches soap operas and game shows.  Her and her family pink cards all the time.  They go out to eat often.  She is not suicidal now and never has been.  The patient denies use of alcohol or drugs.  She does have some questionable visual hallucinations that occur perhaps once a week but they do not distress her.  She typically sees an African-American young girl who does not speak to her.  She does not seem like it bothers her very much although it bothers her sister when the patient tells her about that.  The patient denies paranoia.  She denies any symptoms of of a specific anxiety disorder.  Patient presently takes Zoloft 100 mg which she has taken for 2 years with no true benefit.  The patient still actually drives a car now.  Her sister Jenna Kelley does all the cooking and [name] pays all the bills.  Her sister also puts out her medicines.  Fortunately the patient has not had any accidents.  Today the patient denies ever having an episode of major depression or of manic symptoms. Her medical history is significant for a DVT and now is on Eliquis.  She has  hypercholesterolemia and takes Crestor. Her psychiatric history demonstrates no history of psychiatric hospitalization and has never been evaluated by a psychiatrist.  She has never been in therapy.  Associated Signs/Symptoms: Depression Symptoms:   (Hypo) Manic Symptoms:   Anxiety Symptoms:  Mild transient anxiety Psychotic Symptoms:  Hallucinations: Visual PTSD Symptoms: Negative  Past Psychiatric History: None  Previous Psychotropic Medications: Yes   Substance Abuse History in the last 12 months:  No.  Consequences of Substance Abuse: NA  Past Medical History:  Past Medical History:  Diagnosis Date   Cataract    DVT (deep venous thrombosis) (HCC)    left leg   Osteoarthritis    shoulder   Umbilical hernia     Past Surgical History:  Procedure Laterality Date   NO PAST SURGERIES      Family Psychiatric History:   Family History:  Family History  Problem Relation Age of Onset   Diabetes Mother    Diabetes Sister    Hypertension Sister    Lung cancer Sister    Breast cancer Neg Hx     Social History:   Social History   Socioeconomic History   Marital status: Widowed    Spouse name: Not on file   Number of children: 0   Years of education: Not on file   Highest education level: Not on file  Occupational History   Occupation: retired  Tobacco Use   Smoking  status: Never   Smokeless tobacco: Never  Vaping Use   Vaping status: Never Used  Substance and Sexual Activity   Alcohol use: No   Drug use: Not Currently   Sexual activity: Not Currently  Other Topics Concern   Not on file  Social History Narrative   Not on file   Social Drivers of Health   Financial Resource Strain: Low Risk  (10/09/2019)   Overall Financial Resource Strain (CARDIA)    Difficulty of Paying Living Expenses: Not very hard  Food Insecurity: No Food Insecurity (06/15/2023)   Hunger Vital Sign    Worried About Running Out of Food in the Last Year: Never true    Ran Out of  Food in the Last Year: Never true  Transportation Needs: No Transportation Needs (06/15/2023)   PRAPARE - Administrator, Civil Service (Medical): No    Lack of Transportation (Non-Medical): No  Physical Activity: Not on file  Stress: Not on file  Social Connections: Not on file    Additional Social History:   Allergies:  No Known Allergies  Metabolic Disorder Labs: No results found for: "HGBA1C", "MPG" No results found for: "PROLACTIN" No results found for: "CHOL", "TRIG", "HDL", "CHOLHDL", "VLDL", "LDLCALC" Lab Results  Component Value Date   TSH 0.684 10/06/2019    Therapeutic Level Labs: No results found for: "LITHIUM" No results found for: "CBMZ" No results found for: "VALPROATE"  Current Medications: Current Outpatient Medications  Medication Sig Dispense Refill   apixaban (ELIQUIS) 5 MG TABS tablet Take 1 tablet (5 mg total) by mouth 2 (two) times daily. 180 tablet 1   busPIRone (BUSPAR) 10 MG tablet Take 1 tablet (10 mg total) by mouth 3 (three) times daily. 1 qam  1  qhs 60 tablet 5   Multiple Vitamin (MULTIVITAMIN WITH MINERALS) TABS tablet Take 1 tablet by mouth daily.     rosuvastatin (CRESTOR) 5 MG tablet Take 5 mg by mouth at bedtime.     sertraline (ZOLOFT) 50 MG tablet Take 1 tablet (50 mg total) by mouth every morning. 1 qam for 1 week then DC it 7 tablet 0   No current facility-administered medications for this visit.    Musculoskeletal: Strength & Muscle Tone: decreased Gait & Station: unable to stand Patient leans: Right and N/A  Psychiatric Specialty Exam: Review of Systems  Blood pressure 127/75, pulse 64, height 5\' 5"  (1.651 m), weight 167 lb (75.8 kg).Body mass index is 27.79 kg/m.  General Appearance: Casual  Eye Contact:  Good  Speech:  NA  Volume:  Normal  Mood:  Anxious  Affect:  NA  Thought Process:  Goal Directed  Orientation:  Full (Time, Place, and Person)  Thought Content:  Logical  Suicidal Thoughts:  No  Homicidal  Thoughts:  No  Memory:  NA  Judgement:  Good  Insight:  Fair  Psychomotor Activity:  Normal  Concentration:  Concentration: Fair.  He said  Recall:  Jenna Kelley of Knowledge:Fair  Language: Fair  Akathisia:  No  Handed:  Right  AIMS (if indicated):  not done  Assets:  Desire for Improvement  ADL's:  Intact  Cognition: WNL  Sleep:  Fair   Screenings: PHQ2-9    Flowsheet Row Office Visit from 06/15/2023 in [facility] [place] Patient Outreach Telephone from 07/28/2021 in [facility] Patient Outreach Telephone from 10/19/2020 in [facility] Patient Outreach Telephone from 10/09/2019  in [facility]  PHQ-2 Total Score 0 0 0 0      Flowsheet Row ED from 05/24/2023 in [facility] at [facility] ED from 09/14/2022 in [facility] Emergency Department at [facility]  C-SSRS RISK CATEGORY No Risk No Risk       Assessment and Plan:   In today's evaluation there were some things that were very evident.  The first is the patient has a significant hearing impairment.  The first part of her plan is for her to go to [facility] for a hearing assessment and possible need for hearing aids.  This is exactly what her primary care doctor told her this month.  Her questionable visual hallucinations do not seem to cause all that much distress and I am not sure it is worth it to expose her to a neuroleptic at this time.  On her next visit we should do a Mini-Mental status exam to assess her cognitive abilities.  We will also do a closer evaluation of her sleep pattern.  For now our interventions are to taper her off her Zoloft and once off of it we will begin on BuSpar 10 mg twice daily.  The patient is functioning actually quite well.  She has a very supportive brother and sister who she lives with.  We will also talk a little bit more with her about her driving.  This patient is to return to see [place] in 2  months.  Collaboration of Care:   Patient/Guardian was advised Release of Information must be obtained prior to any record release in order to collaborate their care with an outside provider. Patient/Guardian was advised if they have not already done so to contact the registration department to sign all necessary forms in order for [place] to release information regarding their care.   Consent: Patient/Guardian gives verbal consent for treatment and assignment of benefits for services provided during this visit. Patient/Guardian expressed understanding and agreed to proceed.   Gypsy Balsam, MD 1/17/20259:37 AM

## 2024-01-07 ENCOUNTER — Other Ambulatory Visit: Payer: Self-pay | Admitting: Family Medicine

## 2024-01-07 DIAGNOSIS — Z1231 Encounter for screening mammogram for malignant neoplasm of breast: Secondary | ICD-10-CM

## 2024-01-13 ENCOUNTER — Telehealth (HOSPITAL_COMMUNITY): Payer: Self-pay

## 2024-01-13 NOTE — Telephone Encounter (Signed)
 This is a patient of Dr. Levie Ream, patients sister called and said that she does not think the Buspar  is working, she said patient is seeing things and this is causing anxiety and distress. Please review and advise, thank you

## 2024-01-17 NOTE — Telephone Encounter (Signed)
I contacted patients sister and she is okay with waiting to speak with Dr. Donell Beers

## 2024-01-28 ENCOUNTER — Other Ambulatory Visit (HOSPITAL_COMMUNITY): Payer: Self-pay | Admitting: *Deleted

## 2024-01-28 MED ORDER — BUSPIRONE HCL 10 MG PO TABS: 10.0000 mg | ORAL_TABLET | Freq: Three times a day (TID) | ORAL | 4 refills | Status: DC

## 2024-02-04 DIAGNOSIS — R5383 Other fatigue: Secondary | ICD-10-CM | POA: Diagnosis not present

## 2024-02-04 DIAGNOSIS — E559 Vitamin D deficiency, unspecified: Secondary | ICD-10-CM | POA: Diagnosis not present

## 2024-02-04 DIAGNOSIS — E785 Hyperlipidemia, unspecified: Secondary | ICD-10-CM | POA: Diagnosis not present

## 2024-02-04 LAB — LAB REPORT - SCANNED
Calcium: 10.2
EGFR: 96

## 2024-02-11 ENCOUNTER — Other Ambulatory Visit (HOSPITAL_COMMUNITY): Payer: Self-pay

## 2024-02-11 DIAGNOSIS — E782 Mixed hyperlipidemia: Secondary | ICD-10-CM | POA: Diagnosis not present

## 2024-02-11 DIAGNOSIS — Z789 Other specified health status: Secondary | ICD-10-CM | POA: Diagnosis not present

## 2024-02-11 DIAGNOSIS — E559 Vitamin D deficiency, unspecified: Secondary | ICD-10-CM | POA: Diagnosis not present

## 2024-02-11 DIAGNOSIS — D72819 Decreased white blood cell count, unspecified: Secondary | ICD-10-CM | POA: Diagnosis not present

## 2024-02-17 ENCOUNTER — Ambulatory Visit
Admission: RE | Admit: 2024-02-17 | Discharge: 2024-02-17 | Disposition: A | Discharge status: Home / Self Care | Payer: Medicare HMO | Source: Ambulatory Visit | Attending: Family Medicine | Admitting: Family Medicine

## 2024-02-17 DIAGNOSIS — Z1231 Encounter for screening mammogram for malignant neoplasm of breast: Secondary | ICD-10-CM | POA: Diagnosis not present

## 2024-02-19 ENCOUNTER — Ambulatory Visit (HOSPITAL_BASED_OUTPATIENT_CLINIC_OR_DEPARTMENT_OTHER): Payer: Medicare HMO | Admitting: Psychiatry

## 2024-02-19 VITALS — BP 109/69 | HR 79 | Ht 65.0 in | Wt 180.0 lb

## 2024-02-19 DIAGNOSIS — F03918 Unspecified dementia, unspecified severity, with other behavioral disturbance: Secondary | ICD-10-CM

## 2024-02-19 MED ORDER — REXULTI 0.5 MG PO TABS: ORAL_TABLET | ORAL | 4 refills | Status: DC

## 2024-02-19 MED ORDER — BUSPIRONE HCL 15 MG PO TABS: ORAL_TABLET | ORAL | 5 refills | Status: DC

## 2024-02-19 NOTE — Progress Notes (Signed)
 Psychiatric Initial Adult Assessment   Patient Identification: Jenna Kelley MRN:  [id number] Date of Evaluation:  02/19/2024 Referral Source: Dr. Maryelizabeth Rowan Chief Complaint: Anxiety Visit Diagnosis:   Today the patient is seen with her daughter Jenna Kelley and her son Jenna Kelley who she lives with.  They said that she is distinctly worse.  When they saw me a month or 2 ago the patient was having some visual hallucinations but now she feels she is being pulled down controlled.  Her psychosis is worse.  She hears voices talks about.  She acts aggressive.  She throws down her cane she throws down her walker.  She clearly is more agitated in a physical way according to her family.  The patient does most of her basic ADLs.  The patient is completely oriented but the family reports her memory is distinctly impaired.  I believe most likely this patient has a cognitive impairment most likely that of Alzheimer's dementia based upon her age.  The patient is very hearing-impaired.  It is almost impossible to do a Mini-Mental status exam.       Associated Signs/Symptoms: Depression Symptoms:   (Hypo) Manic Symptoms:   Anxiety Symptoms:  Mild transient anxiety Psychotic Symptoms:  Hallucinations: Visual PTSD Symptoms: Negative  Past Psychiatric History: None  Previous Psychotropic Medications: Yes   Substance Abuse History in the last 12 months:  No.  Consequences of Substance Abuse: NA  Past Medical History:  Past Medical History:  Diagnosis Date   Cataract    DVT (deep venous thrombosis) (HCC)    left leg   Osteoarthritis    shoulder   Umbilical hernia     Past Surgical History:  Procedure Laterality Date   NO PAST SURGERIES      Family Psychiatric History:   Family History:  Family History  Problem Relation Age of Onset   Diabetes Mother    Diabetes Sister    Hypertension Sister    Lung cancer Sister    Breast cancer Neg Hx     Social History:   Social History    Socioeconomic History   Marital status: Widowed    Spouse name: Not on file   Number of children: 0   Years of education: Not on file   Highest education level: Not on file  Occupational History   Occupation: retired  Tobacco Use   Smoking status: Never   Smokeless tobacco: Never  Vaping Use   Vaping status: Never Used  Substance and Sexual Activity   Alcohol use: No   Drug use: Not Currently   Sexual activity: Not Currently  Other Topics Concern   Not on file  Social History Narrative   Not on file   Social Drivers of Health   Financial Resource Strain: Low Risk  (10/09/2019)   Overall Financial Resource Strain (CARDIA)    Difficulty of Paying Living Expenses: Not very hard  Food Insecurity: No Food Insecurity (06/15/2023)   Hunger Vital Sign    Worried About Running Out of Food in the Last Year: Never true    Ran Out of Food in the Last Year: Never true  Transportation Needs: No Transportation Needs (06/15/2023)   PRAPARE - Administrator, Civil Service (Medical): No    Lack of Transportation (Non-Medical): No  Physical Activity: Not on file  Stress: Not on file  Social Connections: Not on file    Additional Social History:   Allergies:  No Known Allergies  Metabolic Disorder Labs: No  results found for: "HGBA1C", "MPG" No results found for: "PROLACTIN" No results found for: "CHOL", "TRIG", "HDL", "CHOLHDL", "VLDL", "LDLCALC" Lab Results  Component Value Date   TSH 0.684 10/06/2019    Therapeutic Level Labs: No results found for: "LITHIUM" No results found for: "CBMZ" No results found for: "VALPROATE"  Current Medications: Current Outpatient Medications  Medication Sig Dispense Refill   Brexpiprazole (REXULTI) 0.5 MG TABS 1 at bedtime for  1week then2 qam for 1 week 60 tablet 4   apixaban (ELIQUIS) 5 MG TABS tablet Take 1 tablet (5 mg total) by mouth 2 (two) times daily. 180 tablet 1   busPIRone (BUSPAR) 15 MG tablet 2qm  1  qhs 90 tablet 5    Multiple Vitamin (MULTIVITAMIN WITH MINERALS) TABS tablet Take 1 tablet by mouth daily.     rosuvastatin (CRESTOR) 5 MG tablet Take 5 mg by mouth at bedtime.     sertraline (ZOLOFT) 50 MG tablet Take 1 tablet (50 mg total) by mouth every morning. 1 qam for 1 week then DC it 7 tablet 0   No current facility-administered medications for this visit.    Musculoskeletal: Strength & Muscle Tone: decreased Gait & Station: unable to stand Patient leans: Right and N/A  Psychiatric Specialty Exam: Review of Systems  Blood pressure 109/69, pulse 79, height 5\' 5"  (1.651 m), weight 180 lb (81.6 kg).Body mass index is 29.95 kg/m.  General Appearance: Casual  Eye Contact:  Good  Speech:  NA  Volume:  Normal  Mood:  Anxious  Affect:  NA  Thought Process:  Goal Directed  Orientation:  Full (Time, Place, and Person)  Thought Content:  Logical  Suicidal Thoughts:  No  Homicidal Thoughts:  No  Memory:  NA  Judgement:  Good  Insight:  Fair  Psychomotor Activity:  Normal  Concentration:  Concentration: Fair.  He said  Recall:  [name] of Knowledge:Fair  Language: Fair  Akathisia:  No  Handed:  Right  AIMS (if indicated):  not done  Assets:  Desire for Improvement  ADL's:  Intact  Cognition: WNL  Sleep:  Fair   Screenings: PHQ2-9    Flowsheet Row Office Visit from 06/15/2023 in [facility] Of [place] Patient Outreach Telephone from 07/28/2021 in [facility] Patient Outreach Telephone from 10/19/2020 in [facility] Patient Outreach Telephone from 10/09/2019 in [facility]  PHQ-2 Total Score 0 0 0 0      Flowsheet Row ED from 05/24/2023 in [facility] at [facility] ED from 09/14/2022 in [facility] Emergency Department at [facility]  C-SSRS RISK CATEGORY No Risk No Risk       Assessment and Plan:    Today we will begin her on Rexulti and build her up to a dose of at least 1  mg.  We will increase her BuSpar to 15 mg 3 a day a total of 45 mg.  We will make contact with her primary care Dr. Maryelizabeth Rowan and probably make plans to get a CAT scan of her head.  We will also go ahead and get some blood work that were arranged for the patient to come back for.  The patient has really no complaints.  She does not remember any of the aggressive behaviors that her family says she does on a regular nearly daily basis.  She had a scheduled appointment to see [place] in 6 to 7  weeks but will contact her before then.  Collaboration of Care:   Patient/Guardian was advised Release of Information must be obtained prior to any record release in order to collaborate their care with an outside provider. Patient/Guardian was advised if they have not already done so to contact the registration department to sign all necessary forms in order for [place] to release information regarding their care.   Consent: Patient/Guardian gives verbal consent for treatment and assignment of benefits for services provided during this visit. Patient/Guardian expressed understanding and agreed to proceed.   Gypsy Balsam, MD 3/19/20251:55 PM

## 2024-03-31 ENCOUNTER — Ambulatory Visit (HOSPITAL_BASED_OUTPATIENT_CLINIC_OR_DEPARTMENT_OTHER): Admitting: Psychiatry

## 2024-03-31 DIAGNOSIS — F323 Major depressive disorder, single episode, severe with psychotic features: Secondary | ICD-10-CM | POA: Diagnosis not present

## 2024-03-31 DIAGNOSIS — Z79899 Other long term (current) drug therapy: Secondary | ICD-10-CM

## 2024-03-31 DIAGNOSIS — F03918 Unspecified dementia, unspecified severity, with other behavioral disturbance: Secondary | ICD-10-CM | POA: Diagnosis not present

## 2024-03-31 MED ORDER — TEMAZEPAM 15 MG PO CAPS
15.0000 mg | ORAL_CAPSULE | Freq: Every evening | ORAL | 0 refills | Status: DC | PRN
Start: 2024-03-31 — End: 2024-04-28

## 2024-03-31 MED ORDER — REXULTI 0.5 MG PO TABS: ORAL_TABLET | ORAL | 4 refills | Status: DC

## 2024-03-31 MED ORDER — BREXPIPRAZOLE 2 MG PO TABS: 2.0000 mg | ORAL_TABLET | Freq: Every day | ORAL | 5 refills | Status: DC

## 2024-04-01 ENCOUNTER — Telehealth (HOSPITAL_COMMUNITY): Payer: Self-pay

## 2024-04-01 NOTE — Progress Notes (Signed)
Opened encounter to order labs.

## 2024-04-01 NOTE — Progress Notes (Signed)
 Psychiatric Initial Adult Assessment   Patient Identification: Jenna Kelley MRN:  [id number] Date of Evaluation:  04/01/2024 Referral Source: Dr. Rayma Calandra Chief Complaint: Anxiety Visit Diagnosis:     Today the patient is seen again with [name] and [name] her family members.  She is living with them at this time.  They apparently told her if when no other family members would and now they would read.  The patient is very hard to manage.  She is stays up all the time.  She is aggressive.  On her last visit we began her on Rexulti  and got up to 1 mg which she has had no benefit.  She is very hard to get her to sit still.  She also is incontinent.  They realized that they cannot meet her needs and they are requesting at this time to try to look into other living situations.  I believe she needs to have an assessment for this intervention to find her new setting to live.  Today we reviewed some of her labs but we are still looking to get a TSH and a B12.  We also need to clarify if she has had a brain scan.  Noted is the patient is on no medications for a memory loss.  I suspect she also needs a urinalysis.  Associated Signs/Symptoms: Depression Symptoms:   (Hypo) Manic Symptoms:   Anxiety Symptoms:  Mild transient anxiety Psychotic Symptoms:  Hallucinations: Visual PTSD Symptoms: Negative  Past Psychiatric History: None  Previous Psychotropic Medications: Yes   Substance Abuse History in the last 12 months:  No.  Consequences of Substance Abuse: NA  Past Medical History:  Past Medical History:  Diagnosis Date   Cataract    DVT (deep venous thrombosis) (HCC)    left leg   Osteoarthritis    shoulder   Umbilical hernia     Past Surgical History:  Procedure Laterality Date   NO PAST SURGERIES      Family Psychiatric History:   Family History:  Family History  Problem Relation Age of Onset   Diabetes Mother    Diabetes Sister    Hypertension Sister    Lung  cancer Sister    Breast cancer Neg Hx     Social History:   Social History   Socioeconomic History   Marital status: Widowed    Spouse name: Not on file   Number of children: 0   Years of education: Not on file   Highest education level: Not on file  Occupational History   Occupation: retired  Tobacco Use   Smoking status: Never   Smokeless tobacco: Never  Vaping Use   Vaping status: Never Used  Substance and Sexual Activity   Alcohol  use: No   Drug use: Not Currently   Sexual activity: Not Currently  Other Topics Concern   Not on file  Social History Narrative   Not on file   Social Drivers of Health   Financial Resource Strain: Low Risk  (10/09/2019)   Overall Financial Resource Strain (CARDIA)    Difficulty of Paying Living Expenses: Not very hard  Food Insecurity: No Food Insecurity (06/15/2023)   Hunger Vital Sign    Worried About Running Out of Food in the Last Year: Never true    Ran Out of Food in the Last Year: Never true  Transportation Needs: No Transportation Needs (06/15/2023)   PRAPARE - Administrator, Civil Service (Medical): No    Lack of Transportation (  Non-Medical): No  Physical Activity: Not on file  Stress: Not on file  Social Connections: Not on file    Additional Social History:   Allergies:  No Known Allergies  Metabolic Disorder Labs: No results found for: "HGBA1C", "MPG" No results found for: "PROLACTIN" No results found for: "CHOL", "TRIG", "HDL", "CHOLHDL", "VLDL", "LDLCALC" Lab Results  Component Value Date   TSH 0.684 10/06/2019    Therapeutic Level Labs: No results found for: "LITHIUM" No results found for: "CBMZ" No results found for: "VALPROATE"  Current Medications: Current Outpatient Medications  Medication Sig Dispense Refill   brexpiprazole  (REXULTI ) 2 MG TABS tablet Take 1 tablet (2 mg total) by mouth daily. 30 tablet 5   temazepam (RESTORIL) 15 MG capsule Take 1 capsule (15 mg total) by mouth at  bedtime as needed for sleep. 30 capsule 0   apixaban  (ELIQUIS ) 5 MG TABS tablet Take 1 tablet (5 mg total) by mouth 2 (two) times daily. 180 tablet 1   busPIRone  (BUSPAR ) 15 MG tablet 2qm  1  qhs 90 tablet 5   Multiple Vitamin (MULTIVITAMIN WITH MINERALS) TABS tablet Take 1 tablet by mouth daily.     rosuvastatin (CRESTOR) 5 MG tablet Take 5 mg by mouth at bedtime.     sertraline  (ZOLOFT ) 50 MG tablet Take 1 tablet (50 mg total) by mouth every morning. 1 qam for 1 week then DC it 7 tablet 0   No current facility-administered medications for this visit.    Musculoskeletal: Strength & Muscle Tone: decreased Gait & Station: unable to stand Patient leans: Right and N/A  Psychiatric Specialty Exam: Review of Systems  There were no vitals taken for this visit.There is no height or weight on file to calculate BMI.  General Appearance: Casual  Eye Contact:  Good  Speech:  NA  Volume:  Normal  Mood:  Anxious  Affect:  NA  Thought Process:  Goal Directed  Orientation:  Full (Time, Place, and Person)  Thought Content:  Logical  Suicidal Thoughts:  No  Homicidal Thoughts:  No  Memory:  NA  Judgement:  Good  Insight:  Fair  Psychomotor Activity:  Normal  Concentration:  Concentration: Fair.  He said  Recall:  Iona Manis of Knowledge:Fair  Language: Fair  Akathisia:  No  Handed:  Right  AIMS (if indicated):  not done  Assets:  Desire for Improvement  ADL's:  Intact  Cognition: WNL  Sleep:  Fair   Screenings: PHQ2-9    Flowsheet Row Office Visit from 06/15/2023 in [facility]. 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  Today we will increase her Rexulti  to a dose of 2 mg.  We will attempt to get some more blood work on her including a B12 and a TSH.  We will also make contact with her insurance company to start getting a process  of perhaps a new placement.  I believe the patient should be in the level of care being in a skilled nursing home.  Her other problem is insomnia and today we will give her Restoril for sleep.  She will continue 50 mg of Zoloft .  This patient she will return to see me in 2 to 3 weeks.  Consent: Patient/Guardian gives verbal consent for treatment and assignment of benefits for services provided during this visit. Patient/Guardian expressed understanding and agreed to proceed.   Delorse Fey, MD 4/30/20252:51 PM

## 2024-04-01 NOTE — Telephone Encounter (Signed)
 I reached out to patients Medicare and she is eligible for case management so I called patients sister and gave her the information to follow up. She will call me back if she needs anything further from us 

## 2024-04-02 LAB — COMPREHENSIVE METABOLIC PANEL WITH GFR
ALT: 13 IU/L (ref 0–32)
AST: 20 IU/L (ref 0–40)
Albumin: 3.8 g/dL (ref 3.8–4.8)
Alkaline Phosphatase: 100 IU/L (ref 44–121)
BUN/Creatinine Ratio: 26 (ref 12–28)
BUN: 17 mg/dL (ref 8–27)
Bilirubin Total: 0.4 mg/dL (ref 0.0–1.2)
CO2: 20 mmol/L (ref 20–29)
Calcium: 9.8 mg/dL (ref 8.7–10.3)
Chloride: 99 mmol/L (ref 96–106)
Creatinine, Ser: 0.66 mg/dL (ref 0.57–1.00)
Globulin, Total: 3.5 g/dL (ref 1.5–4.5)
Glucose: 67 mg/dL — ABNORMAL LOW (ref 70–99)
Potassium: 4.8 mmol/L (ref 3.5–5.2)
Sodium: 138 mmol/L (ref 134–144)
Total Protein: 7.3 g/dL (ref 6.0–8.5)
eGFR: 93 mL/min/{1.73_m2} (ref >59)

## 2024-04-02 LAB — B12 AND FOLATE PANEL
Folate: 15.1 ng/mL (ref >3.0)
Vitamin B-12: 337 pg/mL (ref 232–1245)

## 2024-04-02 LAB — TSH: TSH: 0.913 u[IU]/mL (ref 0.450–4.500)

## 2024-04-06 ENCOUNTER — Telehealth (HOSPITAL_COMMUNITY): Payer: Self-pay | Admitting: *Deleted

## 2024-04-06 NOTE — Telephone Encounter (Signed)
 Pt's sister [name]) called to request that FL-2 be filled out and faxed to [facility] in [place]. Sister says that pt has a bed but they're waiting on FL-2. Form submitted for Dr. Levie Ream to review when he is in office tomorrow.

## 2024-04-13 ENCOUNTER — Telehealth: Payer: Self-pay

## 2024-04-13 DIAGNOSIS — M810 Age-related osteoporosis without current pathological fracture: Secondary | ICD-10-CM | POA: Diagnosis not present

## 2024-04-13 DIAGNOSIS — I10 Essential (primary) hypertension: Secondary | ICD-10-CM | POA: Diagnosis not present

## 2024-04-13 DIAGNOSIS — F0390 Unspecified dementia without behavioral disturbance: Secondary | ICD-10-CM | POA: Diagnosis not present

## 2024-04-13 DIAGNOSIS — I82411 Acute embolism and thrombosis of right femoral vein: Secondary | ICD-10-CM

## 2024-04-13 DIAGNOSIS — I6529 Occlusion and stenosis of unspecified carotid artery: Secondary | ICD-10-CM | POA: Diagnosis not present

## 2024-04-13 DIAGNOSIS — F331 Major depressive disorder, recurrent, moderate: Secondary | ICD-10-CM | POA: Diagnosis not present

## 2024-04-13 DIAGNOSIS — E785 Hyperlipidemia, unspecified: Secondary | ICD-10-CM | POA: Diagnosis not present

## 2024-04-15 ENCOUNTER — Telehealth: Payer: Self-pay

## 2024-04-15 ENCOUNTER — Ambulatory Visit (HOSPITAL_COMMUNITY): Admitting: Psychiatry

## 2024-04-15 NOTE — Progress Notes (Signed)
 Complex Care Management Note  Care Guide Note 04/15/2024 Name: [name] MRN: [id number] DOB: 1951/06/13  Jenna Kelley is a 73 y.o. year old female who sees Aleta Anda, MD for primary care. I reached out to [employer] by phone today to offer complex care management services.  [name] was given information about Complex Care Management services today including:   The Complex Care Management services include support from the care team which includes your Nurse Care Manager, Clinical Social Worker, or Pharmacist.  The Complex Care Management team is here to help remove barriers to the health concerns and goals most important to you. Complex Care Management services are voluntary, and the patient may decline or stop services at any time by request to their care team member.   Complex Care Management Consent Status: Patient agreed to services and verbal consent obtained.   Follow up plan:  Telephone appointment with complex care management team member scheduled for:  04/22/2024  Encounter Outcome:  Patient Scheduled  Lenton Rail , RMA     [place]  [facility], [facility] Guide  Direct Dial: [phone]  Website: [email or web address]

## 2024-04-20 ENCOUNTER — Encounter (HOSPITAL_COMMUNITY): Payer: Self-pay | Admitting: Emergency Medicine

## 2024-04-20 ENCOUNTER — Other Ambulatory Visit: Payer: Self-pay

## 2024-04-20 ENCOUNTER — Emergency Department (HOSPITAL_COMMUNITY)
Admission: EM | Admit: 2024-04-20 | Discharge: 2024-04-21 | Disposition: A | Discharge status: Home / Self Care | Attending: Emergency Medicine | Admitting: Emergency Medicine

## 2024-04-20 DIAGNOSIS — R42 Dizziness and giddiness: Secondary | ICD-10-CM | POA: Diagnosis not present

## 2024-04-20 LAB — COMPREHENSIVE METABOLIC PANEL WITH GFR
ALT: 14 U/L (ref 0–44)
AST: 22 U/L (ref 15–41)
Albumin: 3.1 g/dL — ABNORMAL LOW (ref 3.5–5.0)
Alkaline Phosphatase: 75 U/L (ref 38–126)
Anion gap: 9 (ref 5–15)
BUN: 13 mg/dL (ref 8–23)
CO2: 26 mmol/L (ref 22–32)
Calcium: 9.5 mg/dL (ref 8.9–10.3)
Chloride: 104 mmol/L (ref 98–111)
Creatinine, Ser: 0.7 mg/dL (ref 0.44–1.00)
GFR, Estimated: >60 mL/min (ref >60)
Glucose, Bld: 100 mg/dL — ABNORMAL HIGH (ref 70–99)
Potassium: 4.2 mmol/L (ref 3.5–5.1)
Sodium: 139 mmol/L (ref 135–145)
Total Bilirubin: 0.8 mg/dL (ref 0.0–1.2)
Total Protein: 7.1 g/dL (ref 6.5–8.1)

## 2024-04-20 LAB — CBC
HCT: 42.6 % (ref 36.0–46.0)
Hemoglobin: 13.2 g/dL (ref 12.0–15.0)
MCH: 28.6 pg (ref 26.0–34.0)
MCHC: 31 g/dL (ref 30.0–36.0)
MCV: 92.2 fL (ref 80.0–100.0)
Platelets: 194 10*3/uL (ref 150–400)
RBC: 4.62 MIL/uL (ref 3.87–5.11)
RDW: 14.1 % (ref 11.5–15.5)
WBC: 4.4 10*3/uL (ref 4.0–10.5)
nRBC: 0 % (ref 0.0–0.2)

## 2024-04-20 LAB — CBG MONITORING, ED: Glucose-Capillary: 107 mg/dL — ABNORMAL HIGH (ref 70–99)

## 2024-04-20 NOTE — ED Triage Notes (Signed)
 Pt arrived POV from home reporting "jerking movement" and intermittent light-headedness while in the car. Family also reports concern for circulation to bilateral lower extremities. Hx of DVT, on Eliquis .

## 2024-04-21 NOTE — Discharge Instructions (Signed)
You were evaluated in the Emergency Department and after careful evaluation, we did not find any emergent condition requiring admission or further testing in the hospital.  Your exam/testing today is overall reassuring.  Recommend continued follow-up with your regular doctors.  Please return to the Emergency Department if you experience any worsening of your condition.   Thank you for allowing us to be a part of your care. 

## 2024-04-21 NOTE — ED Provider Notes (Signed)
 [facility] Emergency Department Provider Note MRN:  [id number]  Arrival date & time: 04/21/24     Chief Complaint   Dizziness   History of Present Illness   Jenna Kelley is a 73 y.o. year-old female with a history of DVT presenting to the ED with chief complaint of dizziness.  Episode of dizziness or lightheadedness today while trying to get out of the car.  During this episode family member felt her legs and they felt cold and this worried her.  Was exhibiting some tremulousness during this episode.  Episode was brief and now she feels normal.  Review of Systems  A thorough review of systems was obtained and all systems are negative except as noted in the HPI and PMH.   Patient's Health History    Past Medical History:  Diagnosis Date   Cataract    DVT (deep venous thrombosis) (HCC)    left leg   Osteoarthritis    shoulder   Umbilical hernia     Past Surgical History:  Procedure Laterality Date   NO PAST SURGERIES      Family History  Problem Relation Age of Onset   Diabetes Mother    Diabetes Sister    Hypertension Sister    Lung cancer Sister    Breast cancer Neg Hx     Social History   Socioeconomic History   Marital status: Widowed    Spouse name: Not on file   Number of children: 0   Years of education: Not on file   Highest education level: Not on file  Occupational History   Occupation: retired  Tobacco Use   Smoking status: Never   Smokeless tobacco: Never  Vaping Use   Vaping status: Never Used  Substance and Sexual Activity   Alcohol  use: No   Drug use: Not Currently   Sexual activity: Not Currently  Other Topics Concern   Not on file  Social History Narrative   Not on file   Social Drivers of Health   Financial Resource Strain: Low Risk  (10/09/2019)   Overall Financial Resource Strain (CARDIA)    Difficulty of Paying Living Expenses: Not very hard  Food Insecurity: No Food Insecurity (06/15/2023)    Hunger Vital Sign    Worried About Running Out of Food in the Last Year: Never true    Ran Out of Food in the Last Year: Never true  Transportation Needs: No Transportation Needs (06/15/2023)   PRAPARE - Administrator, Civil Service (Medical): No    Lack of Transportation (Non-Medical): No  Physical Activity: Not on file  Stress: Not on file  Social Connections: Not on file  Intimate Partner Violence: Not At Risk (06/15/2023)   Humiliation, Afraid, Rape, and Kick questionnaire    Fear of Current or Ex-Partner: No    Emotionally Abused: No    Physically Abused: No    Sexually Abused: No     Physical Exam   Vitals:   04/21/24 0129  BP: 119/61  Pulse: (!) 59  Resp: 18  Temp: 97.6 F (36.4 C)  SpO2: 100%    CONSTITUTIONAL: Well-appearing, NAD NEURO/PSYCH:  Alert and oriented to name only, moves all extremities equally EYES:  eyes equal and reactive ENT/NECK:  no LAD, no JVD CARDIO: Regular rate, well-perfused, normal S1 and S2 PULM:  CTAB no wheezing or rhonchi GI/GU:  non-distended, non-tender MSK/SPINE:  No gross deformities, no edema SKIN:  no rash, atraumatic   *Additional and/or  pertinent findings included in MDM below  Diagnostic and Interventional Summary    EKG Interpretation Date/Time:  Monday Apr 20 2024 15:29:13 EDT Ventricular Rate:  58 PR Interval:  162 QRS Duration:  90 QT Interval:  428 QTC Calculation: 420 R Axis:   -48  Text Interpretation: Sinus bradycardia Left anterior fascicular block Moderate voltage criteria for LVH, may be normal variant ( R in aVL , Cornell product ) T wave abnormality, consider inferior ischemia T wave abnormality, consider anterolateral ischemia Abnormal ECG When compared with ECG of 14-Sep-2022 19:38, PREVIOUS ECG IS PRESENT Confirmed by Gwenetta Lennert [id number]) on 04/21/2024 1:09:03 AM       Labs Reviewed  COMPREHENSIVE METABOLIC PANEL WITH GFR - Abnormal; Notable for the following components:      Result  Value   Glucose, Bld 100 (*)    Albumin 3.1 (*)    All other components within normal limits  CBG MONITORING, ED - Abnormal; Notable for the following components:   Glucose-Capillary 107 (*)    All other components within normal limits  CBC    No orders to display    Medications - No data to display   Procedures  /  Critical Care Procedures  ED Course and Medical Decision Making  Initial Impression and Ddx Patient is well-appearing in no acute distress with reassuring vital signs.  Brief episode of dizziness, described as more of a lightheadedness.  Legs felt cold but on my exam they are warm and well-perfused with no significant swelling, no signs of DVT or infection.  Abdomen soft nontender, has not been sick recently, denies fever, denies dysuria, no cough.  Lungs clear.  Has fairly advanced dementia but no focal neurological deficits to suggest a CNS emergency.  Suspect orthostatic event.  Patient is no longer dizzy, does not have any nystagmus, doubt posterior circulation etiology.  Past medical/surgical history that increases complexity of ED encounter: DVT on Eliquis   Interpretation of Diagnostics I personally reviewed the EKG and my interpretation is as follows: Sinus bradycardia  No significant blood count or electrolyte disturbance.  Patient Reassessment and Ultimate Disposition/Management     Appropriate for discharge with follow-up.  Patient management required discussion with the following services or consulting groups:  None  Complexity of Problems Addressed Acute illness or injury that poses threat of life of bodily function  Additional Data Reviewed and Analyzed Further history obtained from: Further history from spouse/family member  Additional Factors Impacting ED Encounter Risk Consideration of hospitalization  Merrick Abe. Harless Lien, MD [facility] Emergency Medicine [facility] [email or web address]  Final Clinical Impressions(s) / ED  Diagnoses     ICD-10-CM   1. Lightheadedness  R42       ED Discharge Orders     None        Discharge Instructions Discussed with and Provided to Patient:   Discharge Instructions      You were evaluated in the Emergency Department and after careful evaluation, we did not find any emergent condition requiring admission or further testing in the hospital.  Your exam/testing today is overall reassuring.  Recommend continued follow-up with your regular doctors.  Please return to the Emergency Department if you experience any worsening of your condition.   Thank you for allowing us  to be a part of your care.      Edson Graces, MD 04/21/24 [phone]

## 2024-04-22 ENCOUNTER — Other Ambulatory Visit: Payer: Self-pay | Admitting: Licensed Clinical Social Worker

## 2024-04-22 NOTE — Patient Instructions (Signed)
 Visit Information  Thank you for taking time to visit with me today. Please don't hesitate to contact me if I can be of assistance to you before our next scheduled appointment.  Your next care management appointment is by telephone on 04/28/2024 at 9am  Telephone follow up appointment date/time:  04/28/2024 9am  Please call the care guide team at [phone] if you need to cancel, schedule, or reschedule an appointment.   Please call 911 if you are experiencing a Mental Health or Behavioral Health Crisis or need someone to talk to.  Fletcher Humble MSW, LCSW Licensed Clinical Social Worker  [facility], Population Health Direct Dial: [phone]  Fax: [phone]

## 2024-04-22 NOTE — Patient Outreach (Signed)
 Complex Care Management   Visit Note  04/22/2024  Name:  Jenna Kelley MRN: [id number] DOB: Dec 02, 1951  Situation: Referral received for Complex Care Management related to level of care snf placement I obtained verbal consent from Caregiver.  Visit completed with Jenna Kelley   on the phone  Pt sister Jenna Kelley is pt primary caregiver. Jenna. Jenna Kelley reports is unable to provided adequate care for Jenna Kelley and is requesting snf placement. LCSW Jenna Kelley provided Jenna. Jenna Kelley information for [facility], [facility], [facility], [facility] to contact for pertinent information. LCSW Jenna Kelley also contacted [employer] [facility], [facility] and [facility] unable to spk w admission left contact information for callback  Background:   Past Medical History:  Diagnosis Date   Cataract    DVT (deep venous thrombosis) (HCC)    left leg   Osteoarthritis    shoulder   Umbilical hernia     Assessment: Patient Reported Symptoms:  Cognitive Cognitive Status: Unable to Assess Cognitive/Intellectual Conditions Management [RPT]: Not Assessed      Neurological Neurological Review of Symptoms: Not assessed    HEENT HEENT Symptoms Reported: Not assessed      Cardiovascular Cardiovascular Symptoms Reported: Not assessed    Respiratory Respiratory Symptoms Reported: Not assesed    Endocrine Patient reports the following symptoms related to hypoglycemia or hyperglycemia : Not assessed    Gastrointestinal Gastrointestinal Symptoms Reported: Not assessed      Genitourinary Genitourinary Symptoms Reported: Not assessed    Integumentary Integumentary Symptoms Reported: Not assessed    Musculoskeletal Musculoskelatal Symptoms Reviewed: Not assessed        Psychosocial       Do you feel physically threatened by others?: No      06/15/2023    9:50 AM  Depression screen PHQ 2/9  Decreased Interest 0  Down, Depressed, Hopeless 0  PHQ - 2 Score 0    There were no vitals filed for this  visit.  Medications Reviewed Today     Reviewed by Jenna Humble, LCSW (Social Worker) on 04/22/24 at 1652  Med List Status: <None>   Medication Order Taking? Sig Documenting Provider Last Dose Status Informant  apixaban  (ELIQUIS ) 5 MG TABS tablet [id number] No Take 1 tablet (5 mg total) by mouth 2 (two) times daily. Jenna Kelley B, RPH-CPP Taking Active   brexpiprazole  (REXULTI ) 2 MG TABS tablet [id number]  Take 1 tablet (2 mg total) by mouth daily. Jenna Ally, MD  Active   busPIRone  (BUSPAR ) 15 MG tablet [id number]  2qm  1  qhs Jenna Kelley, Jenna Gasmen, MD  Active   Multiple Vitamin (MULTIVITAMIN WITH MINERALS) TABS tablet [id number] No Take 1 tablet by mouth daily. [provider] Taking Active Self  rosuvastatin (CRESTOR) 5 MG tablet [id number] No Take 5 mg by mouth at bedtime. [provider] Taking Active   sertraline  (ZOLOFT ) 50 MG tablet [id number]  Take 1 tablet (50 mg total) by mouth every morning. 1 qam for 1 week then DC it Jenna Kelley, Jenna Gasmen, MD  Active   temazepam  (RESTORIL ) 15 MG capsule [id number]  Take 1 capsule (15 mg total) by mouth at bedtime as needed for sleep. Jenna Ally, MD  Active             Recommendation:   Jenna. Jenna Kelley encouraged to contact snf locations for admissions information   Follow Up Plan:   Telephone follow up appointment date/time:  04/28/2024 9am   Jenna Kelley MSW, LCSW Licensed Clinical Social Worker  [facility],  Population Health Direct Dial: [phone]  Fax: [phone]

## 2024-04-24 ENCOUNTER — Ambulatory Visit (HOSPITAL_BASED_OUTPATIENT_CLINIC_OR_DEPARTMENT_OTHER): Admitting: Psychiatry

## 2024-04-24 ENCOUNTER — Encounter (HOSPITAL_COMMUNITY): Payer: Self-pay | Admitting: Psychiatry

## 2024-04-24 ENCOUNTER — Other Ambulatory Visit: Payer: Self-pay

## 2024-04-24 VITALS — BP 112/64 | HR 61 | Ht 65.0 in | Wt 180.0 lb

## 2024-04-24 DIAGNOSIS — F03918 Unspecified dementia, unspecified severity, with other behavioral disturbance: Secondary | ICD-10-CM

## 2024-04-24 NOTE — Progress Notes (Signed)
 Psychiatric Initial Adult Assessment   Patient Identification: Jenna Kelley MRN:  [id number] Date of Evaluation:  04/24/2024 Referral Source: Dr. Rayma Kelley Chief Complaint: Anxiety Visit Diagnosis:    .  Today once again the patient was seen in the office with her sister Jenna Kelley and her brother Jenna Kelley.  The patient is not doing any better in fact they say she is worse.  Worsening she is screaming and yelling and verbally agitated and not sleeping at all.  She cannot do most of her basic ADLs.  She needs her elderly sister to help.  Since her last visit we have been able to arrange for them to have contact with their insurance company [employer] who is working with him to try to find new placement for her.  I do believe this patient should be in a skilled nursing setting.  Presently they are working on getting a new sadness for this patient in an assisted living center or some other place for her.  The family is having direct contact with the insurance company.  The family says the patient is not sleeping at all and keeps them up all night.  She is still screaming and yelling and describing people crying all over her.  She has been on Rexulti  2 mg as had no benefit at all.  She been taking Restoril  15 mg with no benefit.  At this time we will go ahead and switch her to Risperdal 1 mg twice daily and discontinue her Restoril .  We will begin her on Ativan 1 mg at night and then in 2 days to increase it to 2 mg if she is not sleeping.  The family is very well aware that these are very sedating medications and they will watch her close.  They were instructed that if in fact she becomes over sedated they should discontinue the Risperdal in the morning.  Therefore they should only give her the Risperdal at night if in fact she gets over sedated.  Associated Signs/Symptoms: Depression Symptoms:   (Hypo) Manic Symptoms:   Anxiety Symptoms:  Mild transient anxiety Psychotic Symptoms:  Hallucinations:  Visual PTSD Symptoms: Negative  Past Psychiatric History: None  Previous Psychotropic Medications: Yes   Substance Abuse History in the last 12 months:  No.  Consequences of Substance Abuse: NA  Past Medical History:  Past Medical History:  Diagnosis Date   Cataract    DVT (deep venous thrombosis) (HCC)    left leg   Osteoarthritis    shoulder   Umbilical hernia     Past Surgical History:  Procedure Laterality Date   NO PAST SURGERIES      Family Psychiatric History:   Family History:  Family History  Problem Relation Age of Onset   Diabetes Mother    Diabetes Sister    Hypertension Sister    Lung cancer Sister    Breast cancer Neg Hx     Social History:   Social History   Socioeconomic History   Marital status: Widowed    Spouse name: Not on file   Number of children: 0   Years of education: Not on file   Highest education level: Not on file  Occupational History   Occupation: retired  Tobacco Use   Smoking status: Never   Smokeless tobacco: Never  Vaping Use   Vaping status: Never Used  Substance and Sexual Activity   Alcohol  use: No   Drug use: Not Currently   Sexual activity: Not Currently  Other Topics  Concern   Not on file  Social History Narrative   Not on file   Social Drivers of Health   Financial Resource Strain: Low Risk  (10/09/2019)   Overall Financial Resource Strain (CARDIA)    Difficulty of Paying Living Expenses: Not very hard  Food Insecurity: No Food Insecurity (04/22/2024)   Hunger Vital Sign    Worried About Running Out of Food in the Last Year: Never true    Ran Out of Food in the Last Year: Never true  Transportation Needs: No Transportation Needs (04/22/2024)   PRAPARE - Administrator, Civil Service (Medical): No    Lack of Transportation (Non-Medical): No  Physical Activity: Not on file  Stress: Not on file  Social Connections: Not on file    Additional Social History:   Allergies:  No Known  Allergies  Metabolic Disorder Labs: No results found for: "HGBA1C", "MPG" No results found for: "PROLACTIN" No results found for: "CHOL", "TRIG", "HDL", "CHOLHDL", "VLDL", "LDLCALC" Lab Results  Component Value Date   TSH 0.913 03/31/2024    Therapeutic Level Labs: No results found for: "LITHIUM" No results found for: "CBMZ" No results found for: "VALPROATE"  Current Medications: Current Outpatient Medications  Medication Sig Dispense Refill   apixaban  (ELIQUIS ) 5 MG TABS tablet Take 1 tablet (5 mg total) by mouth 2 (two) times daily. 180 tablet 1   brexpiprazole  (REXULTI ) 2 MG TABS tablet Take 1 tablet (2 mg total) by mouth daily. 30 tablet 5   busPIRone  (BUSPAR ) 15 MG tablet 2qm  1  qhs 90 tablet 5   Multiple Vitamin (MULTIVITAMIN WITH MINERALS) TABS tablet Take 1 tablet by mouth daily.     rosuvastatin (CRESTOR) 5 MG tablet Take 5 mg by mouth at bedtime.     sertraline  (ZOLOFT ) 50 MG tablet Take 1 tablet (50 mg total) by mouth every morning. 1 qam for 1 week then DC it 7 tablet 0   temazepam  (RESTORIL ) 15 MG capsule Take 1 capsule (15 mg total) by mouth at bedtime as needed for sleep. 30 capsule 0   No current facility-administered medications for this visit.    Musculoskeletal: Strength & Muscle Tone: decreased Gait & Station: unable to stand Patient leans: Right and N/A  Psychiatric Specialty Exam: Review of Systems  Blood pressure 112/64, pulse 61, height 5\' 5"  (1.651 m), weight 180 lb (81.6 kg).Body mass index is 29.95 kg/m.  General Appearance: Casual  Eye Contact:  Good  Speech:  NA  Volume:  Normal  Mood:  Anxious  Affect:  NA  Thought Process:  Goal Directed  Orientation:  Full (Time, Place, and Person)  Thought Content:  Logical  Suicidal Thoughts:  No  Homicidal Thoughts:  No  Memory:  NA  Judgement:  Good  Insight:  Fair  Psychomotor Activity:  Normal  Concentration:  Concentration: Fair.  He said  Recall:  Jenna Kelley of Knowledge:Fair  Language:  Fair  Akathisia:  No  Handed:  Right  AIMS (if indicated):  not done  Assets:  Desire for Improvement  ADL's:  Intact  Cognition: WNL  Sleep:  Fair   Screenings: PHQ2-9    Flowsheet Row Office Visit from 06/15/2023 in [facility] [place] Patient Outreach Telephone from 07/28/2021 in [facility] Patient Outreach Telephone from 10/19/2020 in [facility] Patient Outreach Telephone from 10/09/2019 in [facility]  PHQ-2 Total Score 0 0  0 0      Flowsheet Row ED from 04/20/2024 in [facility] at [facility] from 05/24/2023 in [facility] at [facility] ED from 09/14/2022 in [facility] Emergency Department at [facility]  C-SSRS RISK CATEGORY No Risk No Risk No Risk       Assessment and Plan:      Collaboration of Care:    Interview of this patient's chart it is now seen that she did have a CAT scan that was not significant clinically.  Further she has had all the blood work of a workup for dementia which was all negative.  Her TSH B12 and other blood work were all within normal limits.  At this time he found that the Rexulti  2 mg has not worked in with triggers and to use a more sedating antipsychotic medication.  She will begin on Risperdal 1 mg in the morning and 1 mg at night.  Family was instructed that if she comes over sedated to discontinue the 1 mg in the morning and just take the 1 mg at night.  For sleep we will discontinue her Restoril  and should begin on Ativan 1 mg at night and if she is not sleeping she can go up to taking 2 mg.  This patient was given a return appointment in about 1 month.  It is my hope by that time she will tell new placement.  Consent: Patient/Guardian gives verbal consent for treatment and assignment of benefits for services provided during this visit. Patient/Guardian expressed understanding and agreed to proceed.    Delorse Fey, MD 5/23/202511:28 AM

## 2024-04-28 ENCOUNTER — Other Ambulatory Visit (HOSPITAL_COMMUNITY): Payer: Self-pay

## 2024-04-28 DIAGNOSIS — F03918 Unspecified dementia, unspecified severity, with other behavioral disturbance: Secondary | ICD-10-CM

## 2024-04-28 DIAGNOSIS — F4322 Adjustment disorder with anxiety: Secondary | ICD-10-CM

## 2024-04-28 MED ORDER — RISPERIDONE 1 MG PO TABS: 1.0000 mg | ORAL_TABLET | Freq: Two times a day (BID) | ORAL | 0 refills | Status: DC

## 2024-04-28 MED ORDER — LORAZEPAM 1 MG PO TABS: 1.0000 mg | ORAL_TABLET | Freq: Every day | ORAL | 0 refills | Status: DC

## 2024-05-18 DIAGNOSIS — R5383 Other fatigue: Secondary | ICD-10-CM | POA: Diagnosis not present

## 2024-05-18 DIAGNOSIS — E559 Vitamin D deficiency, unspecified: Secondary | ICD-10-CM | POA: Diagnosis not present

## 2024-05-20 ENCOUNTER — Other Ambulatory Visit (HOSPITAL_COMMUNITY): Payer: Self-pay | Admitting: *Deleted

## 2024-05-20 ENCOUNTER — Telehealth (HOSPITAL_COMMUNITY): Payer: Self-pay | Admitting: *Deleted

## 2024-05-20 DIAGNOSIS — F03918 Unspecified dementia, unspecified severity, with other behavioral disturbance: Secondary | ICD-10-CM

## 2024-05-20 DIAGNOSIS — F411 Generalized anxiety disorder: Secondary | ICD-10-CM | POA: Diagnosis not present

## 2024-05-20 DIAGNOSIS — H9190 Unspecified hearing loss, unspecified ear: Secondary | ICD-10-CM | POA: Diagnosis not present

## 2024-05-20 DIAGNOSIS — E559 Vitamin D deficiency, unspecified: Secondary | ICD-10-CM | POA: Diagnosis not present

## 2024-05-20 DIAGNOSIS — I10 Essential (primary) hypertension: Secondary | ICD-10-CM | POA: Diagnosis not present

## 2024-05-20 MED ORDER — REXULTI 3 MG PO TABS: 3.0000 mg | ORAL_TABLET | Freq: Every morning | ORAL | 2 refills | Status: AC

## 2024-05-20 MED ORDER — RISPERIDONE 0.25 MG PO TABS: 1.0000 mg | ORAL_TABLET | Freq: Two times a day (BID) | ORAL | 2 refills | Status: DC

## 2024-05-20 NOTE — Telephone Encounter (Signed)
 Writer returned sister's call regarding pt behavior being out of control and I can't take care of her (pt) anymore, it's too much! sister, [name], was very upset and screaming that pt is just too much and that pt needs to be in ACLF as they cannot handle her behaviors. Pt continues to act out per her sister, as in up all night throwing and breaking things, being aggressive with her brother and sister. Writer discussed this with Dr. Levie Ream who gave VO for medication change; Risperdal  0.25 mg BID and Rexulti  3 mg QAM. This nurse LVM for [name] to advise of medication changes and where medications were sent ([facility] on [address]) and also advised that is pt is overly sedated to please contact our office asap.

## 2024-05-27 ENCOUNTER — Emergency Department (HOSPITAL_COMMUNITY)
Admission: EM | Admit: 2024-05-27 | Discharge: 2024-05-29 | Disposition: A | Discharge status: Skilled Nursing Facility | Attending: Emergency Medicine | Admitting: Emergency Medicine

## 2024-05-27 ENCOUNTER — Other Ambulatory Visit: Payer: Self-pay

## 2024-05-27 ENCOUNTER — Emergency Department (HOSPITAL_COMMUNITY)

## 2024-05-27 ENCOUNTER — Encounter (HOSPITAL_COMMUNITY): Payer: Self-pay

## 2024-05-27 DIAGNOSIS — Y92009 Unspecified place in unspecified non-institutional (private) residence as the place of occurrence of the external cause: Secondary | ICD-10-CM | POA: Insufficient documentation

## 2024-05-27 DIAGNOSIS — W19XXXA Unspecified fall, initial encounter: Secondary | ICD-10-CM | POA: Insufficient documentation

## 2024-05-27 DIAGNOSIS — S199XXA Unspecified injury of neck, initial encounter: Secondary | ICD-10-CM | POA: Diagnosis not present

## 2024-05-27 DIAGNOSIS — R9431 Abnormal electrocardiogram [ECG] [EKG]: Secondary | ICD-10-CM | POA: Diagnosis not present

## 2024-05-27 DIAGNOSIS — R509 Fever, unspecified: Secondary | ICD-10-CM | POA: Diagnosis not present

## 2024-05-27 DIAGNOSIS — R519 Headache, unspecified: Secondary | ICD-10-CM | POA: Diagnosis present

## 2024-05-27 DIAGNOSIS — R29818 Other symptoms and signs involving the nervous system: Secondary | ICD-10-CM | POA: Diagnosis not present

## 2024-05-27 DIAGNOSIS — F0390 Unspecified dementia without behavioral disturbance: Secondary | ICD-10-CM | POA: Insufficient documentation

## 2024-05-27 DIAGNOSIS — M6281 Muscle weakness (generalized): Secondary | ICD-10-CM | POA: Insufficient documentation

## 2024-05-27 DIAGNOSIS — S0012XA Contusion of left eyelid and periocular area, initial encounter: Secondary | ICD-10-CM | POA: Insufficient documentation

## 2024-05-27 DIAGNOSIS — R2689 Other abnormalities of gait and mobility: Secondary | ICD-10-CM | POA: Insufficient documentation

## 2024-05-27 DIAGNOSIS — S0990XA Unspecified injury of head, initial encounter: Secondary | ICD-10-CM | POA: Diagnosis not present

## 2024-05-27 DIAGNOSIS — Z8659 Personal history of other mental and behavioral disorders: Secondary | ICD-10-CM

## 2024-05-27 LAB — COMPREHENSIVE METABOLIC PANEL WITH GFR
ALT: 14 U/L (ref 0–44)
AST: 22 U/L (ref 15–41)
Albumin: 2.9 g/dL — ABNORMAL LOW (ref 3.5–5.0)
Alkaline Phosphatase: 110 U/L (ref 38–126)
Anion gap: 7 (ref 5–15)
BUN: 9 mg/dL (ref 8–23)
CO2: 29 mmol/L (ref 22–32)
Calcium: 9.4 mg/dL (ref 8.9–10.3)
Chloride: 103 mmol/L (ref 98–111)
Creatinine, Ser: 0.66 mg/dL (ref 0.44–1.00)
GFR, Estimated: >60 mL/min (ref >60)
Glucose, Bld: 94 mg/dL (ref 70–99)
Potassium: 3.8 mmol/L (ref 3.5–5.1)
Sodium: 139 mmol/L (ref 135–145)
Total Bilirubin: 0.9 mg/dL (ref 0.0–1.2)
Total Protein: 6.9 g/dL (ref 6.5–8.1)

## 2024-05-27 LAB — DIFFERENTIAL
Abs Immature Granulocytes: 0.02 10*3/uL (ref 0.00–0.07)
Basophils Absolute: 0 10*3/uL (ref 0.0–0.1)
Basophils Relative: 0 %
Eosinophils Absolute: 0.1 10*3/uL (ref 0.0–0.5)
Eosinophils Relative: 2 %
Immature Granulocytes: 1 %
Lymphocytes Relative: 36 %
Lymphs Abs: 1.6 10*3/uL (ref 0.7–4.0)
Monocytes Absolute: 0.4 10*3/uL (ref 0.1–1.0)
Monocytes Relative: 10 %
Neutro Abs: 2.3 10*3/uL (ref 1.7–7.7)
Neutrophils Relative %: 51 %

## 2024-05-27 LAB — CBC
HCT: 41.4 % (ref 36.0–46.0)
Hemoglobin: 13 g/dL (ref 12.0–15.0)
MCH: 28.6 pg (ref 26.0–34.0)
MCHC: 31.4 g/dL (ref 30.0–36.0)
MCV: 91 fL (ref 80.0–100.0)
Platelets: 238 10*3/uL (ref 150–400)
RBC: 4.55 MIL/uL (ref 3.87–5.11)
RDW: 13.5 % (ref 11.5–15.5)
WBC: 4.4 10*3/uL (ref 4.0–10.5)
nRBC: 0 % (ref 0.0–0.2)

## 2024-05-27 MED ORDER — BUSPIRONE HCL 10 MG PO TABS
30.0000 mg | ORAL_TABLET | Freq: Every day | ORAL | Status: DC
  Administered 2024-05-27 – 2024-05-28 (×2): 30 mg via ORAL
  Filled 2024-05-27 (×2): qty 3

## 2024-05-27 MED ORDER — BREXPIPRAZOLE 1 MG PO TABS
3.0000 mg | ORAL_TABLET | Freq: Every morning | ORAL | Status: DC
  Administered 2024-05-28 – 2024-05-29 (×2): 3 mg via ORAL
  Filled 2024-05-27 (×2): qty 3

## 2024-05-27 MED ORDER — APIXABAN 5 MG PO TABS
5.0000 mg | ORAL_TABLET | Freq: Two times a day (BID) | ORAL | Status: DC
  Administered 2024-05-27 – 2024-05-29 (×4): 5 mg via ORAL
  Filled 2024-05-27 (×4): qty 1

## 2024-05-27 MED ORDER — ROSUVASTATIN CALCIUM 5 MG PO TABS
5.0000 mg | ORAL_TABLET | Freq: Every day | ORAL | Status: DC
  Administered 2024-05-27 – 2024-05-28 (×2): 5 mg via ORAL
  Filled 2024-05-27 (×2): qty 1

## 2024-05-27 MED ORDER — ADULT MULTIVITAMIN W/MINERALS CH
1.0000 | ORAL_TABLET | Freq: Every day | ORAL | Status: DC
  Administered 2024-05-27 – 2024-05-29 (×3): 1 via ORAL
  Filled 2024-05-27 (×3): qty 1

## 2024-05-27 MED ORDER — LORAZEPAM 1 MG PO TABS
1.0000 mg | ORAL_TABLET | Freq: Every day | ORAL | Status: DC
  Administered 2024-05-27 – 2024-05-28 (×2): 1 mg via ORAL
  Filled 2024-05-27 (×2): qty 1

## 2024-05-27 MED ORDER — SERTRALINE HCL 50 MG PO TABS
50.0000 mg | ORAL_TABLET | Freq: Every morning | ORAL | Status: DC
  Administered 2024-05-28 – 2024-05-29 (×2): 50 mg via ORAL
  Filled 2024-05-27 (×2): qty 1

## 2024-05-27 MED ORDER — RISPERIDONE 1 MG PO TABS
1.0000 mg | ORAL_TABLET | Freq: Two times a day (BID) | ORAL | Status: DC
  Administered 2024-05-27 – 2024-05-29 (×4): 1 mg via ORAL
  Filled 2024-05-27 (×4): qty 1

## 2024-05-27 MED ORDER — BUSPIRONE HCL 10 MG PO TABS
15.0000 mg | ORAL_TABLET | Freq: Every morning | ORAL | Status: DC
  Administered 2024-05-28 – 2024-05-29 (×2): 15 mg via ORAL
  Filled 2024-05-27 (×2): qty 2

## 2024-05-27 NOTE — ED Triage Notes (Addendum)
 Patient BIB [facility] from home for fall, on EMS arrival family reported that patient attempted to throw herself in the floor but was sitting in a chair, unsure if she fell or not. Patient is on thinners, A&Ox1 at baseline, no complaints per EMS. Patient reports her head hurt when she woke up but she does not know if she fell today. BP 104/68 HR 82 RR 20 96% RA 95 CBG

## 2024-05-27 NOTE — ED Provider Notes (Addendum)
 [place] EMERGENCY DEPARTMENT AT [facility] Provider Note   CSN: [id number] Arrival date & time: 05/27/24  1403     Patient presents with: Jenna Kelley is a 73 y.o. female.    Fall     Patient has a history of DVT umbilical hernia, osteoarthritis.  Patient states that she came to the ED because she was not feeling well.  Patient denies any other specific complaints.  She denies.  She denies chest pain.  She denies vomiting or diarrhea.  Denies any fevers or chills.  No shortness of breath.  EMS reported that family stated the patient was sitting in a chair and may have fallen to the floor but they are not really sure.  Prior to Admission medications   Medication Sig Start Date End Date Taking? Authorizing Provider  apixaban  (ELIQUIS ) 5 MG TABS tablet Take 1 tablet (5 mg total) by mouth 2 (two) times daily. 06/18/23   Barbarann Dixon B, RPH-CPP  Brexpiprazole  (REXULTI ) 3 MG TABS Take 1 tablet (3 mg total) by mouth every morning. 05/20/24   Plovsky, Elna, MD  busPIRone  (BUSPAR ) 15 MG tablet 2qm  1  qhs 02/19/24   Plovsky, Elna, MD  LORazepam  (ATIVAN ) 1 MG tablet Take 1 tablet (1 mg total) by mouth at bedtime. May repeat dose once as needed for sleep. Do not exceed 2 tabs a day 04/28/24   Tasia Elna, MD  Multiple Vitamin (MULTIVITAMIN WITH MINERALS) TABS tablet Take 1 tablet by mouth daily.    [provider]  risperiDONE  (RISPERDAL ) 0.25 MG tablet Take 4 tablets (1 mg total) by mouth 2 (two) times daily. Once at breakfast and once in the evening 05/20/24   Plovsky, Elna, MD  rosuvastatin (CRESTOR) 5 MG tablet Take 5 mg by mouth at bedtime. 10/20/23   [provider]  sertraline  (ZOLOFT ) 50 MG tablet Take 1 tablet (50 mg total) by mouth every morning. 1 qam for 1 week then DC it 12/20/23   Tasia Elna, MD    Allergies: Patient has no known allergies.    Review of Systems  Updated Vital Signs BP (!) 115/54 (BP Location: Right Arm)    Pulse (!) 57   Temp 97.9 F (36.6 C) (Oral)   Resp 14   Ht 1.651 m (5' 5)   Wt 81.6 kg   SpO2 97%   BMI 29.94 kg/m   Physical Exam Vitals and nursing note reviewed.  Constitutional:      Appearance: She is well-developed.  HENT:     Head: Normocephalic.     Comments: Slight bruising noted around the left eye    Right Ear: External ear normal.     Left Ear: External ear normal.   Eyes:     General: No scleral icterus.       Right eye: No discharge.        Left eye: No discharge.     Conjunctiva/sclera: Conjunctivae normal.   Neck:     Trachea: No tracheal deviation.   Cardiovascular:     Rate and Rhythm: Normal rate and regular rhythm.  Pulmonary:     Effort: Pulmonary effort is normal. No respiratory distress.     Breath sounds: Normal breath sounds. No stridor. No wheezing or rales.  Abdominal:     General: Bowel sounds are normal. There is no distension.     Palpations: Abdomen is soft.     Tenderness: There is no abdominal tenderness. There is no guarding  or rebound.   Musculoskeletal:        General: No tenderness or deformity.     Cervical back: Neck supple.     Comments: No pain with range of motion of bilateral upper extremities and lower extremities, no tenderness palpation cervical thoracic or lumbar spine   Skin:    General: Skin is warm and dry.     Findings: No rash.   Neurological:     General: No focal deficit present.     Mental Status: She is alert.     Cranial Nerves: No cranial nerve deficit, dysarthria or facial asymmetry.     Sensory: No sensory deficit.     Motor: No abnormal muscle tone or seizure activity.     Coordination: Coordination normal.     Comments: Patient able to move all extremities,  Psychiatric:        Mood and Affect: Mood normal.     (all labs ordered are listed, but only abnormal results are displayed) Labs Reviewed  COMPREHENSIVE METABOLIC PANEL WITH GFR - Abnormal; Notable for the following components:       Result Value   Albumin 2.9 (*)    All other components within normal limits  CBC  DIFFERENTIAL    EKG: EKG Interpretation Date/Time:  Wednesday May 27 2024 17:37:07 EDT Ventricular Rate:  55 PR Interval:  188 QRS Duration:  94 QT Interval:  472 QTC Calculation: 452 R Axis:   -37  Text Interpretation: Sinus rhythm Abnormal R-wave progression, early transition Left ventricular hypertrophy Abnormal T, consider ischemia, diffuse leads No significant change since last tracing Confirmed by Randol Simmonds [id number]) on 05/27/2024 5:45:36 PM  Radiology: CT HEAD WO CONTRAST Result Date: 05/27/2024 CLINICAL DATA:  Neuro deficit, acute, stroke suspected; Neck trauma (Age >= 65y). EXAM: CT HEAD WITHOUT CONTRAST CT CERVICAL SPINE WITHOUT CONTRAST TECHNIQUE: Multidetector CT imaging of the head and cervical spine was performed following the standard protocol without intravenous contrast. Multiplanar CT image reconstructions of the cervical spine were also generated. RADIATION DOSE REDUCTION: This exam was performed according to the departmental dose-optimization program which includes automated exposure control, adjustment of the mA and/or kV according to patient size and/or use of iterative reconstruction technique. COMPARISON:  CT head 02/13/2023 FINDINGS: CT HEAD FINDINGS Brain: There is no evidence of an acute infarct, intracranial hemorrhage, mass, midline shift, or extra-axial fluid collection. Mild cerebral atrophy is within normal limits for age. The ventricles are normal in size. Vascular: Calcified atherosclerosis at the skull base. No hyperdense vessel. Skull: No acute fracture or suspicious lesion. Sinuses/Orbits: Visualized paranasal sinuses and mastoid air cells are clear. Unremarkable orbits. Other: None. CT CERVICAL SPINE FINDINGS Alignment: Trace anterolisthesis of C4 on C5, C5 on C6, and T1 on T2. Skull base and vertebrae: No acute fracture or suspicious lesion. Soft tissues and spinal canal: No  prevertebral fluid or swelling. No visible canal hematoma. Disc levels: Moderate to severe disc space narrowing at C6-7 with associated degenerative endplate changes. Moderate disc space narrowing at C5-6 and mild narrowing at C4-5. Advanced facet arthrosis, particularly severe on the left at C4-5 and C5-6. Moderate left neural foraminal stenosis at C4-5. No evidence of high-grade spinal canal stenosis. Upper chest: The included lung apices are clear. Other: None. IMPRESSION: No evidence of acute intracranial or cervical spine injury. Electronically Signed   By: Dasie Hamburg M.D.   On: 05/27/2024 16:01   CT CERVICAL SPINE WO CONTRAST Result Date: 05/27/2024 CLINICAL DATA:  Neuro deficit, acute, stroke  suspected; Neck trauma (Age >= 65y). EXAM: CT HEAD WITHOUT CONTRAST CT CERVICAL SPINE WITHOUT CONTRAST TECHNIQUE: Multidetector CT imaging of the head and cervical spine was performed following the standard protocol without intravenous contrast. Multiplanar CT image reconstructions of the cervical spine were also generated. RADIATION DOSE REDUCTION: This exam was performed according to the departmental dose-optimization program which includes automated exposure control, adjustment of the mA and/or kV according to patient size and/or use of iterative reconstruction technique. COMPARISON:  CT head 02/13/2023 FINDINGS: CT HEAD FINDINGS Brain: There is no evidence of an acute infarct, intracranial hemorrhage, mass, midline shift, or extra-axial fluid collection. Mild cerebral atrophy is within normal limits for age. The ventricles are normal in size. Vascular: Calcified atherosclerosis at the skull base. No hyperdense vessel. Skull: No acute fracture or suspicious lesion. Sinuses/Orbits: Visualized paranasal sinuses and mastoid air cells are clear. Unremarkable orbits. Other: None. CT CERVICAL SPINE FINDINGS Alignment: Trace anterolisthesis of C4 on C5, C5 on C6, and T1 on T2. Skull base and vertebrae: No acute fracture  or suspicious lesion. Soft tissues and spinal canal: No prevertebral fluid or swelling. No visible canal hematoma. Disc levels: Moderate to severe disc space narrowing at C6-7 with associated degenerative endplate changes. Moderate disc space narrowing at C5-6 and mild narrowing at C4-5. Advanced facet arthrosis, particularly severe on the left at C4-5 and C5-6. Moderate left neural foraminal stenosis at C4-5. No evidence of high-grade spinal canal stenosis. Upper chest: The included lung apices are clear. Other: None. IMPRESSION: No evidence of acute intracranial or cervical spine injury. Electronically Signed   By: Dasie Hamburg M.D.   On: 05/27/2024 16:01     Procedures   Medications Ordered in the ED - No data to display  Clinical Course as of 05/27/24 1748  Wed May 27, 2024  1631 Head CT and C-spine CT without acute findings. [JK]  1705 CBC and metabolic panel without acute abnormalities [JK]    Clinical Course User Index [JK] Randol Simmonds, MD                                 Medical Decision Making Problems Addressed: Fall, initial encounter: acute illness or injury that poses a threat to life or bodily functions  Amount and/or Complexity of Data Reviewed Labs: ordered. Decision-making details documented in ED Course. Radiology: ordered and independent interpretation performed.   \Patient presented to the ED for evaluation after a fall at home.  Patient is a poor historian but is answering questions appropriately.  She does have history of underlying dementia.  ED workup without signs of any serious injury.  No signs of acute anemia.  No electrolyte abnormalities.  Evaluation and diagnostic testing in the emergency department does not suggest an emergent condition requiring admission or immediate intervention beyond what has been performed at this time.  The patient is safe for discharge and has been instructed to return immediately for worsening symptoms, change in symptoms or any  other concerns.     Final diagnoses:  Fall, initial encounter    ED Discharge Orders     None          Randol Simmonds, MD 05/27/24 1750 Family states they are not able to care for her.  Notified that they will not pick her up.  Pt's behavior is getting worse. Will ask for [facility] assistance.   Randol Simmonds, MD 05/27/24 [phone]

## 2024-05-27 NOTE — Discharge Instructions (Addendum)
 It was our pleasure to provide your ER care today - we hope that you feel better.  Fall precautions.   Follow up with primary care doctor in the coming week.  Return to ER if worse, new symptoms, fevers, new/severe pain, trouble breathing, or other concern.

## 2024-05-27 NOTE — ED Notes (Signed)
 Patient transported to CT

## 2024-05-28 NOTE — ED Notes (Signed)
 Patient sister, Sherrilyn Ada, called and updated on patient status.

## 2024-05-28 NOTE — ED Notes (Signed)
 Pt cleaned of large amount of urine.  Bed linens changed, dry brief placed.  Bed alarm on and active.

## 2024-05-28 NOTE — ED Provider Notes (Signed)
 Emergency Medicine Observation Re-evaluation Note  Balbina Baldonado is a 73 y.o. female, seen on rounds today.  Pt initially presented to the ED for complaints of Fall Currently, the patient is resting in the room.  Physical Exam  BP (!) 124/59   Pulse (!) 55   Temp 98.4 F (36.9 C) (Oral)   Resp 18   Ht 5' 5 (1.651 m)   Wt 81.6 kg   SpO2 98%   BMI 29.94 kg/m  Physical Exam General: resting comfortably, NAD Lungs: normal WOB Psych: currently calm and resting  ED Course / MDM  EKG:EKG Interpretation Date/Time:  Wednesday May 27 2024 17:37:07 EDT Ventricular Rate:  55 PR Interval:  188 QRS Duration:  94 QT Interval:  472 QTC Calculation: 452 R Axis:   -37  Text Interpretation: Sinus rhythm Abnormal R-wave progression, early transition Left ventricular hypertrophy Abnormal T, consider ischemia, diffuse leads No significant change since last tracing Confirmed by Randol Simmonds [phone]) on 05/27/2024 5:45:36 PM  I have reviewed the labs performed to date as well as medications administered while in observation.  Recent changes in the last 24 hours include waiting for evaluation.  Plan  Current plan is for evaluation by PT/OT and consult by TTS/SW.    Bari Roxie HERO, [place] 05/28/24 9094

## 2024-05-28 NOTE — ED Notes (Addendum)
 Patient yelling from room Come on [place]. Get your gift. Upon entering room, patient laying in bed saying look at this gift I made for you. Patient bed soiled with urine. Patient laying in bed laughing. Pericare provided, brief changed, linen changed, patient placed in new gown. Patient requesting to leave with her sister. Patient reminded that her sister was going to visit her in the morning. Patient repositioned in bed. Television on. Bed left in the lowest position with wheels locked. Patient reminded not to get out of bed without assistance. Bed alarm on.

## 2024-05-28 NOTE — Evaluation (Signed)
 Occupational Therapy Evaluation Patient Details Name: Jenna Kelley MRN: [id number] DOB: July 14, 1951 Today's Date: 05/28/2024   History of Present Illness   73 y.o. year-old female with a history of DVT presented 6/25 after possible fall at home.  CT Haed:negative.     Clinical Impressions Patient admitted for the diagnosis above.  PTA she states she lives at home with her brother and sister, who provide any needed assist.  Unclear what her prior level of function is, as she is an unreliable historian.  Currently she is needing heavy Max A for bed mobility, and cannot balance sitting at the EOB to attempt standing.  OT will follow in the acute setting, and if family is unable to provide 24 hour heavy Max A then, Patient will benefit from continued inpatient follow up therapy, <3 hours/day.     If plan is discharge home, recommend the following:   Assist for transportation;Assistance with cooking/housework;Two people to help with walking and/or transfers;A lot of help with bathing/dressing/bathroom     Functional Status Assessment   Patient has had a recent decline in their functional status and demonstrates the ability to make significant improvements in function in a reasonable and predictable amount of time.     Equipment Recommendations   None recommended by OT     Recommendations for Other Services         Precautions/Restrictions   Precautions Precautions: Fall Restrictions Weight Bearing Restrictions Per Provider Order: No     Mobility Bed Mobility Overal bed mobility: Needs Assistance Bed Mobility: Supine to Sit, Sit to Supine, Rolling Rolling: Mod assist   Supine to sit: Max assist Sit to supine: Max assist        Transfers                   General transfer comment: unable to maintain balance at EOB, deferred standing trial      Balance Overall balance assessment: Needs assistance Sitting-balance support: Feet supported,  Bilateral upper extremity supported Sitting balance-Leahy Scale: Poor   Postural control: Posterior lean                                 ADL either performed or assessed with clinical judgement   ADL Overall ADL's : Needs assistance/impaired Eating/Feeding: Set up;Bed level   Grooming: Wash/dry hands;Wash/dry face;Set up;Bed level   Upper Body Bathing: Minimal assistance;Bed level   Lower Body Bathing: Maximal assistance;Bed level   Upper Body Dressing : Minimal assistance;Bed level   Lower Body Dressing: Maximal assistance;Bed level   Toilet Transfer: +2 for physical assistance;Maximal assistance   Toileting- Clothing Manipulation and Hygiene: Total assistance;Bed level       Functional mobility during ADLs: Maximal assistance;+2 for physical assistance;+2 for safety/equipment       Vision   Vision Assessment?: No apparent visual deficits     Perception Perception: Not tested       Praxis Praxis: Not tested       Pertinent Vitals/Pain Pain Assessment Pain Assessment: No/denies pain     Extremity/Trunk Assessment Upper Extremity Assessment Upper Extremity Assessment: Generalized weakness   Lower Extremity Assessment Lower Extremity Assessment: Defer to PT evaluation   Cervical / Trunk Assessment Cervical / Trunk Assessment: Kyphotic   Communication Communication Communication: Impaired Factors Affecting Communication: Reduced clarity of speech;Difficulty expressing self   Cognition Arousal: Alert Behavior During Therapy: WFL for tasks assessed/performed Cognition: No family/caregiver present to determine  baseline             OT - Cognition Comments: Presents confused, increased time for command following.  No indication what baseline is.                 Following commands: Impaired Following commands impaired: Follows one step commands inconsistently, Follows one step commands with increased time      Cueing Techniques:  Verbal cues;Gestural cues;Tactile cues                 Home Living Family/patient expects to be discharged to:: Private residence Living Arrangements: Other relatives Available Help at Discharge: Family;Available 24 hours/day Type of Home: House Home Access: Stairs to enter [employer] of Steps: 4-5 Entrance Stairs-Rails: Right;Left;Can reach both Home Layout: Two level;Able to live on main level with bedroom/bathroom     Bathroom Shower/Tub: Chief Strategy Officer: Standard     Home Equipment: Agricultural consultant (2 wheels);Shower seat;Cane - single point          Prior Functioning/Environment Prior Level of Function : Needs assist             Mobility Comments: Patient stated brother helps her, states she has a RW. ADLs Comments: Patient states sister asists with ADl and iADL    OT Problem List: Decreased strength;Decreased range of motion;Decreased activity tolerance;Impaired balance (sitting and/or standing);Decreased cognition;Decreased safety awareness   OT Treatment/Interventions: Self-care/ADL training;Therapeutic activities;Patient/family education;Balance training;DME and/or AE instruction      OT Goals(Current goals can be found in the care plan section)   Acute Rehab OT Goals OT Goal Formulation: Patient unable to participate in goal setting Time For Goal Achievement: 06/11/24 Potential to Achieve Goals: Good   OT Frequency:  Min 2X/week    Co-evaluation              AM-PAC OT 6 Clicks Daily Activity     Outcome Measure Help from another person eating meals?: A Little Help from another person taking care of personal grooming?: A Little Help from another person toileting, which includes using toliet, bedpan, or urinal?: A Lot Help from another person bathing (including washing, rinsing, drying)?: A Lot Help from another person to put on and taking off regular upper body clothing?: A Little Help from another person to  put on and taking off regular lower body clothing?: A Lot 6 Click Score: 15   End of Session Equipment Utilized During Treatment: Rolling walker (2 wheels)  Activity Tolerance: Patient tolerated treatment well Patient left: in bed;with call bell/phone within reach;with bed alarm set  OT Visit Diagnosis: Unsteadiness on feet (R26.81);Muscle weakness (generalized) (M62.81);Other symptoms and signs involving cognitive function                Time: 9099-9076 OT Time Calculation (min): 23 min Charges:  OT General Charges $OT Visit: 1 Visit OT Evaluation $OT Eval Moderate Complexity: 1 Mod  05/28/2024  RP, OTR/L  Acute Rehabilitation Services  Office:  [phone]   Jenna Kelley 05/28/2024, 9:32 AM

## 2024-05-28 NOTE — NC FL2 (Signed)
 [place]  MEDICAID FL2 LEVEL OF CARE FORM     IDENTIFICATION  Patient Name: Jenna Kelley Birthdate: [birth year] Sex: female Admission Date (Current Location): 05/27/2024  [facility] and [place] Number:  Producer, television/film/video and Address:  [facility], [address], [place], [place] [address]      Provider Number: [id number]  Attending Physician Name and Address:  Dr. Roxie Bough  Relative Name and Phone Number:       Current Level of Care: Hospital Recommended Level of Care: Skilled Nursing Facility Prior Approval Number:    Date Approved/Denied:   PASRR Number: Pending  Discharge Plan: SNF    Current Diagnoses: Patient Active Problem List   Diagnosis Date Noted   Acute deep vein thrombosis (DVT) of calf muscle vein of left lower extremity (HCC) 10/06/2019   DVT (deep venous thrombosis) (HCC) 10/05/2019   Hypokalemia 10/05/2019    Orientation RESPIRATION BLADDER Height & Weight     Self, Time, Situation  Normal Continent Weight: 179 lb 14.3 oz (81.6 kg) Height:  5' 5 (165.1 cm)  BEHAVIORAL SYMPTOMS/MOOD NEUROLOGICAL BOWEL NUTRITION STATUS      Continent Diet  AMBULATORY STATUS COMMUNICATION OF NEEDS Skin   Extensive Assist Verbally Normal                       Personal Care Assistance Level of Assistance  Bathing, Dressing, Feeding Bathing Assistance: Maximum assistance Feeding assistance: Limited assistance Dressing Assistance: Maximum assistance     Functional Limitations Info  Sight, Hearing, Speech Sight Info: Adequate Hearing Info: Adequate Speech Info: Adequate    SPECIAL CARE FACTORS FREQUENCY  PT (By licensed PT), OT (By licensed OT)     PT Frequency: 5x weekly OT Frequency: 5x weekly            Contractures Contractures Info: Not present    Additional Factors Info  Code Status, Allergies Code Status Info: Full Code Allergies Info: No known allergies           Current Medications  (05/28/2024):  This is the current hospital active medication list Current Facility-Administered Medications  Medication Dose Route Frequency Provider Last Rate Last Admin   apixaban  (ELIQUIS ) tablet 5 mg  5 mg Oral BID Randol Simmonds, MD   5 mg at 05/28/24 9043   brexpiprazole  (REXULTI ) tablet 3 mg  3 mg Oral q morning Randol Simmonds, MD   3 mg at 05/28/24 9043   busPIRone  (BUSPAR ) tablet 15 mg  15 mg Oral q morning Randol Simmonds, MD   15 mg at 05/28/24 0957   busPIRone  (BUSPAR ) tablet 30 mg  30 mg Oral QHS Knapp, Jon, MD   30 mg at 05/27/24 2125   LORazepam  (ATIVAN ) tablet 1 mg  1 mg Oral QHS Randol Simmonds, MD   1 mg at 05/27/24 2126   multivitamin with minerals tablet 1 tablet  1 tablet Oral Daily Randol Simmonds, MD   1 tablet at 05/28/24 9043   risperiDONE  (RISPERDAL ) tablet 1 mg  1 mg Oral BID Knapp, Jon, MD   1 mg at 05/28/24 9043   rosuvastatin (CRESTOR) tablet 5 mg  5 mg Oral QHS Randol Simmonds, MD   5 mg at 05/27/24 2126   sertraline  (ZOLOFT ) tablet 50 mg  50 mg Oral q morning Randol Simmonds, MD   50 mg at 05/28/24 9043   Current Outpatient Medications  Medication Sig Dispense Refill   apixaban  (ELIQUIS ) 5 MG TABS tablet Take 1  tablet (5 mg total) by mouth 2 (two) times daily. 180 tablet 1   Brexpiprazole  (REXULTI ) 3 MG TABS Take 1 tablet (3 mg total) by mouth every morning. 30 tablet 2   risperiDONE  (RISPERDAL ) 1 MG tablet Take 1 mg by mouth 2 (two) times daily.     rosuvastatin (CRESTOR) 5 MG tablet Take 5 mg by mouth at bedtime.       Discharge Medications: Please see discharge summary for a list of discharge medications.  Relevant Imaging Results:  Relevant Lab Results:   Additional Information SSN: [id number]  Niels LITTIE Portugal, LCSW
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
## 2024-05-28 NOTE — Evaluation (Signed)
 Physical Therapy Evaluation Patient Details Name: Jenna Kelley MRN: [id number] DOB: October 19, 1951 Today's Date: 05/28/2024  History of Present Illness  Pt is 73 yo presenting to [facility] ED on 6/25 following a fall at home. PMH: DVT, umbilical hernia, OA.  Clinical Impression  Pt is presenting below baseline level of functioning. Currently pt is Max A for bed mobility and unable to get to standing. Pt has a posterior lean in sitting EOB and has difficulty flexing the bil knees especially the R due to pain from severe valgus deformity bil. Due to pt current functional status, home set up and available assistance at home recommending skilled physical therapy services < 3 hours/day in order to address strength, balance and functional mobility to decrease risk for falls, injury, immobility, skin break down and re-hospitalization.          If plan is discharge home, recommend the following: Two people to help with walking and/or transfers;Assistance with cooking/housework;Supervision due to cognitive status;Assist for transportation;Help with stairs or ramp for entrance   Can travel by private vehicle   No    Equipment Recommendations Wheelchair cushion (measurements PT);Hospital bed;Hoyer lift;Wheelchair (measurements PT)     Functional Status Assessment Patient has had a recent decline in their functional status and demonstrates the ability to make significant improvements in function in a reasonable and predictable amount of time.     Precautions / Restrictions Precautions Precautions: Fall Recall of Precautions/Restrictions: Impaired Restrictions Weight Bearing Restrictions Per Provider Order: No      Mobility  Bed Mobility Overal bed mobility: Needs Assistance Bed Mobility: Supine to Sit, Sit to Supine, Rolling Rolling: Mod assist   Supine to sit: Max assist Sit to supine: Max assist   General bed mobility comments: Heavy assist for trunk to mid line. Assist with bil LE to get  back to supine.    Transfers   General transfer comment: Pt with posterior lean in sitting. Unable to lean forward to initiate standing safely.       Balance Overall balance assessment: Needs assistance Sitting-balance support: Feet supported, Bilateral upper extremity supported Sitting balance-Leahy Scale: Poor   Postural control: Posterior lean         Pertinent Vitals/Pain Pain Assessment Pain Assessment: No/denies pain    Home Living Family/patient expects to be discharged to:: Private residence Living Arrangements: Other relatives (lives with brother and sister) Available Help at Discharge: Family;Available 24 hours/day Type of Home: House Home Access: Stairs to enter Entrance Stairs-Rails: Right;Left;Can reach both Entrance Stairs-Number of Steps: 4-5   Home Layout: Two level;Able to live on main level with bedroom/bathroom Home Equipment: Rolling Walker (2 wheels);Shower seat;Cane - single point;BSC/3in1      Prior Function Prior Level of Function : Needs assist  Cognitive Assist : ADLs (cognitive)   ADLs (Cognitive): Set up cues Physical Assist : Mobility (physical) Mobility (physical): Transfers;Gait;Bed mobility   Mobility Comments: Pt was able to ambulate at mod I 2-3 weeks ago but currently requries heavy assist ADLs Comments: Sister assists wtih sponge baths.     Extremity/Trunk Assessment   Upper Extremity Assessment Upper Extremity Assessment: Defer to OT evaluation    Lower Extremity Assessment Lower Extremity Assessment: Generalized weakness    Cervical / Trunk Assessment Cervical / Trunk Assessment: Kyphotic  Communication   Communication Communication: Impaired Factors Affecting Communication: Reduced clarity of speech;Difficulty expressing self    Cognition Arousal: Alert Behavior During Therapy: Anxious   PT - Cognitive impairments: Orientation, Awareness, Problem solving, Initiation, Sequencing, Safety/Judgement  Orientation  impairments: Situation     Following commands: Impaired Following commands impaired: Follows one step commands inconsistently, Follows one step commands with increased time     Cueing Cueing Techniques: Verbal cues, Gestural cues, Tactile cues     General Comments General comments (skin integrity, edema, etc.): sister present during session        Assessment/Plan    PT Assessment Patient needs continued PT services  PT Problem List Decreased strength;Decreased activity tolerance;Decreased balance;Decreased mobility       PT Treatment Interventions DME instruction;Balance training;Gait training;Stair training;Neuromuscular re-education;Functional mobility training;Therapeutic activities;Therapeutic exercise;Patient/family education    PT Goals (Current goals can be found in the Care Plan section)  Acute Rehab PT Goals Patient Stated Goal: To improve mobility and be in a safe environment PT Goal Formulation: With family Time For Goal Achievement: 06/11/24 Potential to Achieve Goals: Fair    Frequency Min 1X/week        AM-PAC PT 6 Clicks Mobility  Outcome Measure Help needed turning from your back to your side while in a flat bed without using bedrails?: A Lot Help needed moving from lying on your back to sitting on the side of a flat bed without using bedrails?: A Lot Help needed moving to and from a bed to a chair (including a wheelchair)?: Total Help needed standing up from a chair using your arms (e.g., wheelchair or bedside chair)?: Total Help needed to walk in hospital room?: Total Help needed climbing 3-5 steps with a railing? : Total 6 Click Score: 8    End of Session Equipment Utilized During Treatment: Gait belt Activity Tolerance: Patient tolerated treatment well Patient left: in bed;with call bell/phone within reach;with family/visitor present;with bed alarm set Nurse Communication: Mobility status PT Visit Diagnosis: Other abnormalities of gait and  mobility (R26.89);Muscle weakness (generalized) (M62.81);History of falling (Z91.81)    Time: 9045-8975 PT Time Calculation (min) (ACUTE ONLY): 30 min   Charges:   PT Evaluation $PT Eval Low Complexity: 1 Low PT Treatments $Therapeutic Activity: 8-22 mins PT General Charges $$ ACUTE PT VISIT: 1 Visit        Dorothyann Maier, DPT, CLT  Acute Rehabilitation Services Office: [phone] (Secure chat preferred)   Dorothyann VEAR Maier 05/28/2024, 12:47 PM
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
## 2024-05-28 NOTE — Progress Notes (Addendum)
 12:50pm: CSW received PT recommendation for SNF.  CSW spoke with Myrene at [facility] to discuss patient's pending PASRR as patient was planned to admit to the facility from home. Myrene states she will finish PASRR. Myrene agreeable for CSW to initiate insurance authorization.  10:38am: CSW received call from Vallery Radar, ART social worker [phone]). Vallery states patient's sister Sherrilyn is the POA.  CSW attempted to reach patient's sister Sherrilyn without success - a voicemail was left requesting a return call.  9:30am: CSW received consult for patient for possible SNF placement. Therapy team has signed in, CSW will wait for their recommendations prior to proceeding with discharge planning.  CSW received a message from Cheri Stinson at [facility] DSS stating patient is involved with the ART program and the assigned social worker will be reaching out to CSW for discussion.  Niels Portugal, MSW, LCSW Transitions of Care  Clinical Social Worker II [phone]

## 2024-05-29 DIAGNOSIS — F03B18 Unspecified dementia, moderate, with other behavioral disturbance: Secondary | ICD-10-CM | POA: Diagnosis not present

## 2024-05-29 DIAGNOSIS — R41841 Cognitive communication deficit: Secondary | ICD-10-CM | POA: Diagnosis not present

## 2024-05-29 DIAGNOSIS — R609 Edema, unspecified: Secondary | ICD-10-CM | POA: Diagnosis not present

## 2024-05-29 DIAGNOSIS — F339 Major depressive disorder, recurrent, unspecified: Secondary | ICD-10-CM | POA: Diagnosis not present

## 2024-05-29 DIAGNOSIS — F329 Major depressive disorder, single episode, unspecified: Secondary | ICD-10-CM | POA: Diagnosis not present

## 2024-05-29 DIAGNOSIS — G309 Alzheimer's disease, unspecified: Secondary | ICD-10-CM | POA: Diagnosis not present

## 2024-05-29 DIAGNOSIS — N390 Urinary tract infection, site not specified: Secondary | ICD-10-CM | POA: Diagnosis not present

## 2024-05-29 DIAGNOSIS — Z7401 Bed confinement status: Secondary | ICD-10-CM | POA: Diagnosis not present

## 2024-05-29 DIAGNOSIS — Z7901 Long term (current) use of anticoagulants: Secondary | ICD-10-CM | POA: Diagnosis not present

## 2024-05-29 DIAGNOSIS — Z86718 Personal history of other venous thrombosis and embolism: Secondary | ICD-10-CM | POA: Diagnosis not present

## 2024-05-29 DIAGNOSIS — H40023 Open angle with borderline findings, high risk, bilateral: Secondary | ICD-10-CM | POA: Diagnosis not present

## 2024-05-29 DIAGNOSIS — S0990XA Unspecified injury of head, initial encounter: Secondary | ICD-10-CM | POA: Diagnosis present

## 2024-05-29 DIAGNOSIS — E876 Hypokalemia: Secondary | ICD-10-CM | POA: Diagnosis not present

## 2024-05-29 DIAGNOSIS — W19XXXA Unspecified fall, initial encounter: Secondary | ICD-10-CM | POA: Diagnosis not present

## 2024-05-29 DIAGNOSIS — H18513 Endothelial corneal dystrophy, bilateral: Secondary | ICD-10-CM | POA: Diagnosis not present

## 2024-05-29 DIAGNOSIS — R4182 Altered mental status, unspecified: Secondary | ICD-10-CM | POA: Diagnosis not present

## 2024-05-29 DIAGNOSIS — R296 Repeated falls: Secondary | ICD-10-CM | POA: Diagnosis not present

## 2024-05-29 DIAGNOSIS — M6281 Muscle weakness (generalized): Secondary | ICD-10-CM | POA: Diagnosis not present

## 2024-05-29 DIAGNOSIS — H2513 Age-related nuclear cataract, bilateral: Secondary | ICD-10-CM | POA: Diagnosis not present

## 2024-05-29 DIAGNOSIS — R5383 Other fatigue: Secondary | ICD-10-CM | POA: Diagnosis not present

## 2024-05-29 DIAGNOSIS — R531 Weakness: Secondary | ICD-10-CM | POA: Diagnosis not present

## 2024-05-29 DIAGNOSIS — S0012XA Contusion of left eyelid and periocular area, initial encounter: Secondary | ICD-10-CM | POA: Diagnosis not present

## 2024-05-29 DIAGNOSIS — F919 Conduct disorder, unspecified: Secondary | ICD-10-CM | POA: Diagnosis not present

## 2024-05-29 DIAGNOSIS — F0390 Unspecified dementia without behavioral disturbance: Secondary | ICD-10-CM | POA: Diagnosis not present

## 2024-05-29 DIAGNOSIS — I959 Hypotension, unspecified: Secondary | ICD-10-CM | POA: Diagnosis not present

## 2024-05-29 DIAGNOSIS — R2689 Other abnormalities of gait and mobility: Secondary | ICD-10-CM | POA: Diagnosis not present

## 2024-05-29 DIAGNOSIS — W06XXXA Fall from bed, initial encounter: Secondary | ICD-10-CM | POA: Diagnosis not present

## 2024-05-29 DIAGNOSIS — H524 Presbyopia: Secondary | ICD-10-CM | POA: Diagnosis not present

## 2024-05-29 DIAGNOSIS — Z741 Need for assistance with personal care: Secondary | ICD-10-CM | POA: Diagnosis not present

## 2024-05-29 DIAGNOSIS — E785 Hyperlipidemia, unspecified: Secondary | ICD-10-CM | POA: Diagnosis not present

## 2024-05-29 DIAGNOSIS — M1990 Unspecified osteoarthritis, unspecified site: Secondary | ICD-10-CM | POA: Diagnosis not present

## 2024-05-29 DIAGNOSIS — F02C18 Dementia in other diseases classified elsewhere, severe, with other behavioral disturbance: Secondary | ICD-10-CM | POA: Diagnosis not present

## 2024-05-29 DIAGNOSIS — F411 Generalized anxiety disorder: Secondary | ICD-10-CM | POA: Diagnosis not present

## 2024-05-29 NOTE — ED Notes (Signed)
 Patient repositioned for comfort.

## 2024-05-29 NOTE — ED Notes (Signed)
 [facility], gave report to [facility]

## 2024-05-29 NOTE — Progress Notes (Addendum)
 11:15am: Patient's PASRR has been approved - #[id number].  Patient can go to room 209 at [facility] via PTAR - RN to call when ready. The number to call for report is [phone].  CSW informed RN and MD of information.  CSW attempted to reach patient's sister Meade to inform her of discharge plan.  CSW spoke with Rebecka Radar of [facility] DSS to inform her of plan.   8:25am: Patient's insurance authorization has been approved [id number], next review date is 06/02/2024.  [facility] staff Jill Ada) initiated PASRR screening and it remains pending at this time. CSW faxed signed FL2 to [employer] to support existing screening.  CSW spoke with [place] at [place] who states she sent FL2 to [employer] yesterday that was signed by patient's PCP. CSW sent FL2 that was completed yesterday to [place] via secure email to upload to the portal.   Niels Portugal, MSW, LCSW Transitions of Care  Clinical Social Worker II [phone]

## 2024-05-29 NOTE — ED Notes (Signed)
 Family will be going out of town.   Meade Durand is also POA- [phone] and wants to be contacted if any updates or changes

## 2024-05-29 NOTE — ED Notes (Signed)
 Patient eating lunch.

## 2024-05-29 NOTE — ED Notes (Signed)
 Pt lying in bed, eyes closed, visible chest rise and fall. No distress noted

## 2024-05-29 NOTE — ED Provider Notes (Addendum)
 Emergency Medicine Observation Re-evaluation Note  Jenna Kelley is a 73 y.o. female, seen on rounds today.  Pt initially presented to the ED for complaints of Fall Currently, the patient is awaiting return home w family vs possible snf placement. No new c/o this AM  Physical Exam  BP (!) 143/67 (BP Location: Left Arm)   Pulse (!) 56   Temp 97.6 F (36.4 C) (Oral)   Resp 20   Ht 1.651 m (5' 5)   Wt 81.6 kg   SpO2 97%   BMI 29.94 kg/m  Physical Exam General: calm. Cardiac: regular rate.  Lungs: breathing comfortably.   ED Course / MDM    I have reviewed the labs performed to date as well as medications administered while in observation.  Recent changes in the last 24 hours include ED obs, reassessment.   Plan  Current plan is for [facility] evaluation and disposition.     Bernard Drivers, MD 05/29/24 0739  TOC team indicates patient accepted to SNF, and ready for d/c.   Vitals table, no distress, comfortable appearing. Pt currently appears stable for ED d/c.      Bernard Drivers, MD 05/29/24 [phone]

## 2024-05-29 NOTE — ED Notes (Signed)
 Patient transferred to [facility] with PTAR. Patient vitally stable, went over dc instructions with family. Family demonstrates understanding. All questions, comments, and concerns addressed.

## 2024-05-29 NOTE — ED Notes (Signed)
Patient is resting. Family is at bedside.

## 2024-05-29 NOTE — ED Notes (Signed)
 This patient was helped with eating breakfast. Estimated 40% of the tray was eaten.

## 2024-05-29 NOTE — ED Notes (Signed)
 NT's bathing and changing patient's linen.  PTAR here for patient transport

## 2024-05-29 NOTE — ED Notes (Signed)
 Family at bedside.

## 2024-05-29 NOTE — ED Notes (Signed)
 [place], NT assisting patient with eating breakfast

## 2024-05-29 NOTE — ED Notes (Signed)
Ptar called unable to give pick up time 

## 2024-06-02 DIAGNOSIS — R296 Repeated falls: Secondary | ICD-10-CM | POA: Diagnosis not present

## 2024-06-02 DIAGNOSIS — E785 Hyperlipidemia, unspecified: Secondary | ICD-10-CM | POA: Diagnosis not present

## 2024-06-02 DIAGNOSIS — F329 Major depressive disorder, single episode, unspecified: Secondary | ICD-10-CM | POA: Diagnosis not present

## 2024-06-02 DIAGNOSIS — F411 Generalized anxiety disorder: Secondary | ICD-10-CM | POA: Diagnosis not present

## 2024-06-03 DIAGNOSIS — E785 Hyperlipidemia, unspecified: Secondary | ICD-10-CM | POA: Diagnosis not present

## 2024-06-03 DIAGNOSIS — R2689 Other abnormalities of gait and mobility: Secondary | ICD-10-CM | POA: Diagnosis not present

## 2024-06-03 DIAGNOSIS — G309 Alzheimer's disease, unspecified: Secondary | ICD-10-CM | POA: Diagnosis not present

## 2024-06-03 DIAGNOSIS — F339 Major depressive disorder, recurrent, unspecified: Secondary | ICD-10-CM | POA: Diagnosis not present

## 2024-06-03 DIAGNOSIS — M6281 Muscle weakness (generalized): Secondary | ICD-10-CM | POA: Diagnosis not present

## 2024-06-03 DIAGNOSIS — M1990 Unspecified osteoarthritis, unspecified site: Secondary | ICD-10-CM | POA: Diagnosis not present

## 2024-06-03 DIAGNOSIS — R531 Weakness: Secondary | ICD-10-CM | POA: Diagnosis not present

## 2024-06-03 DIAGNOSIS — R296 Repeated falls: Secondary | ICD-10-CM | POA: Diagnosis not present

## 2024-06-03 DIAGNOSIS — F03B18 Unspecified dementia, moderate, with other behavioral disturbance: Secondary | ICD-10-CM | POA: Diagnosis not present

## 2024-06-03 DIAGNOSIS — F411 Generalized anxiety disorder: Secondary | ICD-10-CM | POA: Diagnosis not present

## 2024-06-04 ENCOUNTER — Emergency Department (HOSPITAL_COMMUNITY)

## 2024-06-04 ENCOUNTER — Other Ambulatory Visit (HOSPITAL_COMMUNITY): Payer: Self-pay | Admitting: Psychiatry

## 2024-06-04 ENCOUNTER — Emergency Department (HOSPITAL_COMMUNITY)
Admission: EM | Admit: 2024-06-04 | Discharge: 2024-06-05 | Disposition: A | Discharge status: Home / Self Care | Attending: Emergency Medicine | Admitting: Emergency Medicine

## 2024-06-04 ENCOUNTER — Other Ambulatory Visit: Payer: Self-pay

## 2024-06-04 ENCOUNTER — Encounter (HOSPITAL_COMMUNITY): Payer: Self-pay | Admitting: Emergency Medicine

## 2024-06-04 DIAGNOSIS — W06XXXA Fall from bed, initial encounter: Secondary | ICD-10-CM | POA: Diagnosis not present

## 2024-06-04 DIAGNOSIS — S0990XA Unspecified injury of head, initial encounter: Secondary | ICD-10-CM | POA: Diagnosis not present

## 2024-06-04 DIAGNOSIS — Z86718 Personal history of other venous thrombosis and embolism: Secondary | ICD-10-CM | POA: Insufficient documentation

## 2024-06-04 DIAGNOSIS — Z7901 Long term (current) use of anticoagulants: Secondary | ICD-10-CM | POA: Insufficient documentation

## 2024-06-04 DIAGNOSIS — W19XXXA Unspecified fall, initial encounter: Secondary | ICD-10-CM

## 2024-06-04 DIAGNOSIS — R296 Repeated falls: Secondary | ICD-10-CM | POA: Diagnosis not present

## 2024-06-04 NOTE — ED Provider Notes (Signed)
 [facility] Emergency Department Provider Note MRN:  [id number]  Arrival date & time: 06/05/24     Chief Complaint   Fall   History of Present Illness   Jenna Kelley is a 73 y.o. year-old female with a history of DVT on Eliquis  presenting to the ED with chief complaint of fall.  Accidentally rolled off the bed and fell.  Hit her head near her left brow.  Denies loss of consciousness.  No headache, no nausea or vomiting.  No neck pain, no back pain, no chest pain or shortness of breath, no abdominal pain.  No injuries to the arms or legs.  Review of Systems  A thorough review of systems was obtained and all systems are negative except as noted in the HPI and PMH.   Patient's Health History    Past Medical History:  Diagnosis Date   Cataract    DVT (deep venous thrombosis) (HCC)    left leg   Osteoarthritis    shoulder   Umbilical hernia     Past Surgical History:  Procedure Laterality Date   NO PAST SURGERIES      Family History  Problem Relation Age of Onset   Diabetes Mother    Diabetes Sister    Hypertension Sister    Lung cancer Sister    Breast cancer Neg Hx     Social History   Socioeconomic History   Marital status: Widowed    Spouse name: Not on file   Number of children: 0   Years of education: Not on file   Highest education level: Not on file  Occupational History   Occupation: retired  Tobacco Use   Smoking status: Never   Smokeless tobacco: Never  Vaping Use   Vaping status: Never Used  Substance and Sexual Activity   Alcohol  use: No   Drug use: Not Currently   Sexual activity: Not Currently  Other Topics Concern   Not on file  Social History Narrative   Not on file   Social Drivers of Health   Financial Resource Strain: Low Risk  (10/09/2019)   Overall Financial Resource Strain (CARDIA)    Difficulty of Paying Living Expenses: Not very hard  Food Insecurity: No Food Insecurity (04/22/2024)   Hunger Vital  Sign    Worried About Running Out of Food in the Last Year: Never true    Ran Out of Food in the Last Year: Never true  Transportation Needs: No Transportation Needs (04/22/2024)   PRAPARE - Administrator, Civil Service (Medical): No    Lack of Transportation (Non-Medical): No  Physical Activity: Not on file  Stress: Not on file  Social Connections: Not on file  Intimate Partner Violence: Not At Risk (04/22/2024)   Humiliation, Afraid, Rape, and Kick questionnaire    Fear of Current or Ex-Partner: No    Emotionally Abused: No    Physically Abused: No    Sexually Abused: No     Physical Exam   Vitals:   06/05/24 0030 06/05/24 0045  BP: (!) 104/56 115/65  Pulse: (!) 56 63  Resp: 13 (!) 22  SpO2: 100% 98%    CONSTITUTIONAL: Well-appearing, NAD NEURO/PSYCH:  Alert and oriented x 3, no focal deficits EYES:  eyes equal and reactive ENT/NECK:  no LAD, no JVD CARDIO: Regular rate, well-perfused, normal S1 and S2 PULM:  CTAB no wheezing or rhonchi GI/GU:  non-distended, non-tender MSK/SPINE:  No gross deformities, no edema SKIN:  no  rash, atraumatic   *Additional and/or pertinent findings included in MDM below  Diagnostic and Interventional Summary    EKG Interpretation Date/Time:    Ventricular Rate:    PR Interval:    QRS Duration:    QT Interval:    QTC Calculation:   R Axis:      Text Interpretation:         Labs Reviewed - No data to display  CT HEAD WO CONTRAST ( )  Final Result      Medications - No data to display   Procedures  /  Critical Care Procedures  ED Course and Medical Decision Making  Initial Impression and Ddx Minimal complaints, sounds like a mechanical fall off the bed, hit her head.  Completely normal range of motion of the neck, no tenderness to the back, lungs clear and present in all fields, abdomen soft.  Normal range of motion of the extremities.  On blood thinners, obtaining CT head.  Past medical/surgical history  that increases complexity of ED encounter: History of DVT on Eliquis   Interpretation of Diagnostics I personally reviewed the CT head and my interpretation is as follows: N signs of significant traumatic injury  Patient Reassessment and Ultimate Disposition/Management     Discharge  Patient management required discussion with the following services or consulting groups:  None  Complexity of Problems Addressed Acute illness or injury that poses threat of life of bodily function  Additional Data Reviewed and Analyzed Further history obtained from: EMS on arrival  Additional Factors Impacting ED Encounter Risk None  Ozell HERO. Theadore, MD [facility] Emergency Medicine [facility] [email or web address]  Final Clinical Impressions(s) / ED Diagnoses     ICD-10-CM   1. Fall, initial encounter  W19.XXXA     2. Injury of head, initial encounter  S09.90XA     3. Anticoagulated  Z79.01       ED Discharge Orders     None        Discharge Instructions Discussed with and Provided to Patient:     Discharge Instructions      You were evaluated in the Emergency Department and after careful evaluation, we did not find any emergent condition requiring admission or further testing in the hospital.  Your exam/testing today is overall reassuring.  CT scan did not show any significant injuries.  Can use Tylenol  at home for any lingering discomfort.  Please return to the Emergency Department if you experience any worsening of your condition.   Thank you for allowing us  to be a part of your care.       Theadore Ozell HERO, MD 06/05/24 [phone]

## 2024-06-04 NOTE — Progress Notes (Signed)
 Orthopedic Tech Progress Note Patient Details:  Jenna Kelley [birth year] [id number]  Patient ID: Jenna Kelley, female   DOB: [birth year], 73 y.o.   MRN: [id number] LV2T FOT  Jenna Kelley Lukes 06/04/2024, 11:30 PM

## 2024-06-04 NOTE — ED Triage Notes (Signed)
 Pt rolled off the bed and hit her head. Pt is on eliquis  and took it this morning

## 2024-06-05 ENCOUNTER — Emergency Department (HOSPITAL_COMMUNITY)

## 2024-06-05 DIAGNOSIS — S0990XA Unspecified injury of head, initial encounter: Secondary | ICD-10-CM | POA: Diagnosis not present

## 2024-06-05 NOTE — ED Notes (Addendum)
 Patient transported to CT

## 2024-06-05 NOTE — ED Notes (Signed)
Patient is resting comfortably in NAD at this time.

## 2024-06-05 NOTE — ED Notes (Signed)
 Pt discharged. Report given to [facility] nursing and rehab. Pt taken via ptar

## 2024-06-05 NOTE — Discharge Instructions (Addendum)
 You were evaluated in the Emergency Department and after careful evaluation, we did not find any emergent condition requiring admission or further testing in the hospital.  Your exam/testing today is overall reassuring.  CT scan did not show any significant injuries.  Can use Tylenol  at home for any lingering discomfort.  Please return to the Emergency Department if you experience any worsening of your condition.   Thank you for allowing us  to be a part of your care.

## 2024-06-09 DIAGNOSIS — H524 Presbyopia: Secondary | ICD-10-CM | POA: Diagnosis not present

## 2024-06-09 DIAGNOSIS — H40023 Open angle with borderline findings, high risk, bilateral: Secondary | ICD-10-CM | POA: Diagnosis not present

## 2024-06-09 DIAGNOSIS — H2513 Age-related nuclear cataract, bilateral: Secondary | ICD-10-CM | POA: Diagnosis not present

## 2024-06-09 DIAGNOSIS — H18513 Endothelial corneal dystrophy, bilateral: Secondary | ICD-10-CM | POA: Diagnosis not present

## 2024-06-10 DIAGNOSIS — M6281 Muscle weakness (generalized): Secondary | ICD-10-CM | POA: Diagnosis not present

## 2024-06-10 DIAGNOSIS — R296 Repeated falls: Secondary | ICD-10-CM | POA: Diagnosis not present

## 2024-06-10 DIAGNOSIS — F339 Major depressive disorder, recurrent, unspecified: Secondary | ICD-10-CM | POA: Diagnosis not present

## 2024-06-10 DIAGNOSIS — F03B18 Unspecified dementia, moderate, with other behavioral disturbance: Secondary | ICD-10-CM | POA: Diagnosis not present

## 2024-06-10 DIAGNOSIS — R2689 Other abnormalities of gait and mobility: Secondary | ICD-10-CM | POA: Diagnosis not present

## 2024-06-10 DIAGNOSIS — R4182 Altered mental status, unspecified: Secondary | ICD-10-CM | POA: Diagnosis not present

## 2024-06-10 DIAGNOSIS — F411 Generalized anxiety disorder: Secondary | ICD-10-CM | POA: Diagnosis not present

## 2024-06-12 DIAGNOSIS — F919 Conduct disorder, unspecified: Secondary | ICD-10-CM | POA: Diagnosis not present

## 2024-06-15 DIAGNOSIS — F919 Conduct disorder, unspecified: Secondary | ICD-10-CM | POA: Diagnosis not present

## 2024-06-17 ENCOUNTER — Ambulatory Visit (HOSPITAL_COMMUNITY): Admitting: Psychiatry

## 2024-06-17 DIAGNOSIS — R296 Repeated falls: Secondary | ICD-10-CM | POA: Diagnosis not present

## 2024-06-17 DIAGNOSIS — F03B18 Unspecified dementia, moderate, with other behavioral disturbance: Secondary | ICD-10-CM | POA: Diagnosis not present

## 2024-06-17 DIAGNOSIS — F339 Major depressive disorder, recurrent, unspecified: Secondary | ICD-10-CM | POA: Diagnosis not present

## 2024-06-17 DIAGNOSIS — R2689 Other abnormalities of gait and mobility: Secondary | ICD-10-CM | POA: Diagnosis not present

## 2024-06-17 DIAGNOSIS — F411 Generalized anxiety disorder: Secondary | ICD-10-CM | POA: Diagnosis not present

## 2024-06-17 DIAGNOSIS — M6281 Muscle weakness (generalized): Secondary | ICD-10-CM | POA: Diagnosis not present

## 2024-06-19 DIAGNOSIS — N390 Urinary tract infection, site not specified: Secondary | ICD-10-CM | POA: Diagnosis not present

## 2024-06-19 DIAGNOSIS — F411 Generalized anxiety disorder: Secondary | ICD-10-CM | POA: Diagnosis not present

## 2024-06-22 DIAGNOSIS — F411 Generalized anxiety disorder: Secondary | ICD-10-CM | POA: Diagnosis not present

## 2024-06-24 DIAGNOSIS — M6281 Muscle weakness (generalized): Secondary | ICD-10-CM | POA: Diagnosis not present

## 2024-06-24 DIAGNOSIS — R2689 Other abnormalities of gait and mobility: Secondary | ICD-10-CM | POA: Diagnosis not present

## 2024-06-24 DIAGNOSIS — F03B18 Unspecified dementia, moderate, with other behavioral disturbance: Secondary | ICD-10-CM | POA: Diagnosis not present

## 2024-06-24 DIAGNOSIS — F411 Generalized anxiety disorder: Secondary | ICD-10-CM | POA: Diagnosis not present

## 2024-06-24 DIAGNOSIS — F339 Major depressive disorder, recurrent, unspecified: Secondary | ICD-10-CM | POA: Diagnosis not present

## 2024-06-24 DIAGNOSIS — R296 Repeated falls: Secondary | ICD-10-CM | POA: Diagnosis not present

## 2024-06-29 DIAGNOSIS — N390 Urinary tract infection, site not specified: Secondary | ICD-10-CM | POA: Diagnosis not present

## 2024-06-29 DIAGNOSIS — G309 Alzheimer's disease, unspecified: Secondary | ICD-10-CM | POA: Diagnosis not present

## 2024-07-01 DIAGNOSIS — M6281 Muscle weakness (generalized): Secondary | ICD-10-CM | POA: Diagnosis not present

## 2024-07-01 DIAGNOSIS — F339 Major depressive disorder, recurrent, unspecified: Secondary | ICD-10-CM | POA: Diagnosis not present

## 2024-07-01 DIAGNOSIS — R2689 Other abnormalities of gait and mobility: Secondary | ICD-10-CM | POA: Diagnosis not present

## 2024-07-01 DIAGNOSIS — F411 Generalized anxiety disorder: Secondary | ICD-10-CM | POA: Diagnosis not present

## 2024-07-01 DIAGNOSIS — R296 Repeated falls: Secondary | ICD-10-CM | POA: Diagnosis not present

## 2024-07-01 DIAGNOSIS — F03B18 Unspecified dementia, moderate, with other behavioral disturbance: Secondary | ICD-10-CM | POA: Diagnosis not present

## 2024-07-02 DIAGNOSIS — N390 Urinary tract infection, site not specified: Secondary | ICD-10-CM | POA: Diagnosis not present

## 2024-07-08 DIAGNOSIS — N390 Urinary tract infection, site not specified: Secondary | ICD-10-CM | POA: Diagnosis not present

## 2024-07-10 DIAGNOSIS — F411 Generalized anxiety disorder: Secondary | ICD-10-CM | POA: Diagnosis not present

## 2024-07-31 DIAGNOSIS — F411 Generalized anxiety disorder: Secondary | ICD-10-CM | POA: Diagnosis not present

## 2024-08-11 DIAGNOSIS — F339 Major depressive disorder, recurrent, unspecified: Secondary | ICD-10-CM | POA: Diagnosis not present

## 2024-08-11 DIAGNOSIS — R293 Abnormal posture: Secondary | ICD-10-CM | POA: Diagnosis not present

## 2024-08-12 DIAGNOSIS — R293 Abnormal posture: Secondary | ICD-10-CM | POA: Diagnosis not present

## 2024-08-12 DIAGNOSIS — R634 Abnormal weight loss: Secondary | ICD-10-CM | POA: Diagnosis not present

## 2024-08-12 DIAGNOSIS — F339 Major depressive disorder, recurrent, unspecified: Secondary | ICD-10-CM | POA: Diagnosis not present

## 2024-08-13 DIAGNOSIS — F339 Major depressive disorder, recurrent, unspecified: Secondary | ICD-10-CM | POA: Diagnosis not present

## 2024-08-13 DIAGNOSIS — R293 Abnormal posture: Secondary | ICD-10-CM | POA: Diagnosis not present

## 2024-08-15 DIAGNOSIS — F339 Major depressive disorder, recurrent, unspecified: Secondary | ICD-10-CM | POA: Diagnosis not present

## 2024-08-15 DIAGNOSIS — R293 Abnormal posture: Secondary | ICD-10-CM | POA: Diagnosis not present

## 2024-08-17 DIAGNOSIS — F339 Major depressive disorder, recurrent, unspecified: Secondary | ICD-10-CM | POA: Diagnosis not present

## 2024-08-17 DIAGNOSIS — R293 Abnormal posture: Secondary | ICD-10-CM | POA: Diagnosis not present

## 2024-08-18 DIAGNOSIS — R293 Abnormal posture: Secondary | ICD-10-CM | POA: Diagnosis not present

## 2024-08-18 DIAGNOSIS — F339 Major depressive disorder, recurrent, unspecified: Secondary | ICD-10-CM | POA: Diagnosis not present

## 2024-08-18 DIAGNOSIS — N390 Urinary tract infection, site not specified: Secondary | ICD-10-CM | POA: Diagnosis not present

## 2024-08-19 DIAGNOSIS — R293 Abnormal posture: Secondary | ICD-10-CM | POA: Diagnosis not present

## 2024-08-19 DIAGNOSIS — F339 Major depressive disorder, recurrent, unspecified: Secondary | ICD-10-CM | POA: Diagnosis not present

## 2024-08-20 DIAGNOSIS — R293 Abnormal posture: Secondary | ICD-10-CM | POA: Diagnosis not present

## 2024-08-20 DIAGNOSIS — F339 Major depressive disorder, recurrent, unspecified: Secondary | ICD-10-CM | POA: Diagnosis not present

## 2024-08-22 DIAGNOSIS — R293 Abnormal posture: Secondary | ICD-10-CM | POA: Diagnosis not present

## 2024-08-22 DIAGNOSIS — F339 Major depressive disorder, recurrent, unspecified: Secondary | ICD-10-CM | POA: Diagnosis not present

## 2024-08-23 DIAGNOSIS — F339 Major depressive disorder, recurrent, unspecified: Secondary | ICD-10-CM | POA: Diagnosis not present

## 2024-08-23 DIAGNOSIS — R293 Abnormal posture: Secondary | ICD-10-CM | POA: Diagnosis not present

## 2024-08-25 DIAGNOSIS — R293 Abnormal posture: Secondary | ICD-10-CM | POA: Diagnosis not present

## 2024-08-25 DIAGNOSIS — F339 Major depressive disorder, recurrent, unspecified: Secondary | ICD-10-CM | POA: Diagnosis not present

## 2024-08-26 DIAGNOSIS — F339 Major depressive disorder, recurrent, unspecified: Secondary | ICD-10-CM | POA: Diagnosis not present

## 2024-08-26 DIAGNOSIS — R293 Abnormal posture: Secondary | ICD-10-CM | POA: Diagnosis not present

## 2024-08-27 DIAGNOSIS — F339 Major depressive disorder, recurrent, unspecified: Secondary | ICD-10-CM | POA: Diagnosis not present

## 2024-08-27 DIAGNOSIS — R293 Abnormal posture: Secondary | ICD-10-CM | POA: Diagnosis not present

## 2024-08-29 DIAGNOSIS — R293 Abnormal posture: Secondary | ICD-10-CM | POA: Diagnosis not present

## 2024-08-29 DIAGNOSIS — F339 Major depressive disorder, recurrent, unspecified: Secondary | ICD-10-CM | POA: Diagnosis not present

## 2024-08-31 DIAGNOSIS — F339 Major depressive disorder, recurrent, unspecified: Secondary | ICD-10-CM | POA: Diagnosis not present

## 2024-08-31 DIAGNOSIS — R293 Abnormal posture: Secondary | ICD-10-CM | POA: Diagnosis not present

## 2024-09-01 DIAGNOSIS — R293 Abnormal posture: Secondary | ICD-10-CM | POA: Diagnosis not present

## 2024-09-01 DIAGNOSIS — F339 Major depressive disorder, recurrent, unspecified: Secondary | ICD-10-CM | POA: Diagnosis not present

## 2024-09-02 DIAGNOSIS — R293 Abnormal posture: Secondary | ICD-10-CM | POA: Diagnosis not present

## 2024-09-02 DIAGNOSIS — F339 Major depressive disorder, recurrent, unspecified: Secondary | ICD-10-CM | POA: Diagnosis not present

## 2024-09-03 DIAGNOSIS — R293 Abnormal posture: Secondary | ICD-10-CM | POA: Diagnosis not present

## 2024-09-03 DIAGNOSIS — F339 Major depressive disorder, recurrent, unspecified: Secondary | ICD-10-CM | POA: Diagnosis not present

## 2024-09-04 DIAGNOSIS — F339 Major depressive disorder, recurrent, unspecified: Secondary | ICD-10-CM | POA: Diagnosis not present

## 2024-09-07 DIAGNOSIS — F339 Major depressive disorder, recurrent, unspecified: Secondary | ICD-10-CM | POA: Diagnosis not present

## 2024-09-07 DIAGNOSIS — R293 Abnormal posture: Secondary | ICD-10-CM | POA: Diagnosis not present

## 2024-09-08 DIAGNOSIS — F339 Major depressive disorder, recurrent, unspecified: Secondary | ICD-10-CM | POA: Diagnosis not present

## 2024-09-08 DIAGNOSIS — R293 Abnormal posture: Secondary | ICD-10-CM | POA: Diagnosis not present

## 2024-09-09 DIAGNOSIS — F339 Major depressive disorder, recurrent, unspecified: Secondary | ICD-10-CM | POA: Diagnosis not present

## 2024-09-09 DIAGNOSIS — R293 Abnormal posture: Secondary | ICD-10-CM | POA: Diagnosis not present

## 2024-09-11 DIAGNOSIS — F339 Major depressive disorder, recurrent, unspecified: Secondary | ICD-10-CM | POA: Diagnosis not present

## 2024-09-11 DIAGNOSIS — R293 Abnormal posture: Secondary | ICD-10-CM | POA: Diagnosis not present

## 2024-09-12 DIAGNOSIS — F339 Major depressive disorder, recurrent, unspecified: Secondary | ICD-10-CM | POA: Diagnosis not present

## 2024-09-14 DIAGNOSIS — F339 Major depressive disorder, recurrent, unspecified: Secondary | ICD-10-CM | POA: Diagnosis not present

## 2024-09-15 DIAGNOSIS — R293 Abnormal posture: Secondary | ICD-10-CM | POA: Diagnosis not present

## 2024-09-15 DIAGNOSIS — F339 Major depressive disorder, recurrent, unspecified: Secondary | ICD-10-CM | POA: Diagnosis not present

## 2024-09-16 DIAGNOSIS — R293 Abnormal posture: Secondary | ICD-10-CM | POA: Diagnosis not present

## 2024-09-16 DIAGNOSIS — F339 Major depressive disorder, recurrent, unspecified: Secondary | ICD-10-CM | POA: Diagnosis not present

## 2024-09-16 DIAGNOSIS — R296 Repeated falls: Secondary | ICD-10-CM | POA: Diagnosis not present

## 2024-09-17 DIAGNOSIS — R296 Repeated falls: Secondary | ICD-10-CM | POA: Diagnosis not present

## 2024-09-17 DIAGNOSIS — F339 Major depressive disorder, recurrent, unspecified: Secondary | ICD-10-CM | POA: Diagnosis not present

## 2024-09-17 DIAGNOSIS — R293 Abnormal posture: Secondary | ICD-10-CM | POA: Diagnosis not present

## 2024-09-17 DIAGNOSIS — F411 Generalized anxiety disorder: Secondary | ICD-10-CM | POA: Diagnosis not present

## 2024-09-18 DIAGNOSIS — R293 Abnormal posture: Secondary | ICD-10-CM | POA: Diagnosis not present

## 2024-09-18 DIAGNOSIS — F339 Major depressive disorder, recurrent, unspecified: Secondary | ICD-10-CM | POA: Diagnosis not present

## 2024-09-21 DIAGNOSIS — H18513 Endothelial corneal dystrophy, bilateral: Secondary | ICD-10-CM | POA: Diagnosis not present

## 2024-09-21 DIAGNOSIS — H25813 Combined forms of age-related cataract, bilateral: Secondary | ICD-10-CM | POA: Diagnosis not present

## 2024-09-21 DIAGNOSIS — R293 Abnormal posture: Secondary | ICD-10-CM | POA: Diagnosis not present

## 2024-09-21 DIAGNOSIS — F339 Major depressive disorder, recurrent, unspecified: Secondary | ICD-10-CM | POA: Diagnosis not present

## 2024-09-22 DIAGNOSIS — F339 Major depressive disorder, recurrent, unspecified: Secondary | ICD-10-CM | POA: Diagnosis not present

## 2024-09-22 DIAGNOSIS — R293 Abnormal posture: Secondary | ICD-10-CM | POA: Diagnosis not present

## 2024-09-23 DIAGNOSIS — R293 Abnormal posture: Secondary | ICD-10-CM | POA: Diagnosis not present

## 2024-09-23 DIAGNOSIS — F339 Major depressive disorder, recurrent, unspecified: Secondary | ICD-10-CM | POA: Diagnosis not present

## 2024-09-24 DIAGNOSIS — F339 Major depressive disorder, recurrent, unspecified: Secondary | ICD-10-CM | POA: Diagnosis not present

## 2024-09-24 DIAGNOSIS — R293 Abnormal posture: Secondary | ICD-10-CM | POA: Diagnosis not present

## 2024-09-29 DIAGNOSIS — I10 Essential (primary) hypertension: Secondary | ICD-10-CM | POA: Diagnosis not present

## 2024-09-29 DIAGNOSIS — E876 Hypokalemia: Secondary | ICD-10-CM | POA: Diagnosis not present

## 2024-09-29 DIAGNOSIS — E785 Hyperlipidemia, unspecified: Secondary | ICD-10-CM | POA: Diagnosis not present

## 2024-10-16 DIAGNOSIS — H25813 Combined forms of age-related cataract, bilateral: Secondary | ICD-10-CM | POA: Diagnosis not present

## 2024-10-16 DIAGNOSIS — F329 Major depressive disorder, single episode, unspecified: Secondary | ICD-10-CM | POA: Diagnosis not present

## 2024-10-16 DIAGNOSIS — F411 Generalized anxiety disorder: Secondary | ICD-10-CM | POA: Diagnosis not present

## 2024-10-16 DIAGNOSIS — E785 Hyperlipidemia, unspecified: Secondary | ICD-10-CM | POA: Diagnosis not present

## 2024-10-16 DIAGNOSIS — Z01818 Encounter for other preprocedural examination: Secondary | ICD-10-CM | POA: Diagnosis not present

## 2024-10-21 DIAGNOSIS — R1311 Dysphagia, oral phase: Secondary | ICD-10-CM | POA: Diagnosis not present

## 2024-10-21 DIAGNOSIS — M24542 Contracture, left hand: Secondary | ICD-10-CM | POA: Diagnosis not present

## 2024-10-21 DIAGNOSIS — F339 Major depressive disorder, recurrent, unspecified: Secondary | ICD-10-CM | POA: Diagnosis not present

## 2024-10-21 DIAGNOSIS — M1990 Unspecified osteoarthritis, unspecified site: Secondary | ICD-10-CM | POA: Diagnosis not present

## 2024-10-21 DIAGNOSIS — M6281 Muscle weakness (generalized): Secondary | ICD-10-CM | POA: Diagnosis not present

## 2024-10-23 DIAGNOSIS — R1311 Dysphagia, oral phase: Secondary | ICD-10-CM | POA: Diagnosis not present

## 2024-10-23 DIAGNOSIS — M6281 Muscle weakness (generalized): Secondary | ICD-10-CM | POA: Diagnosis not present

## 2024-10-23 DIAGNOSIS — M1990 Unspecified osteoarthritis, unspecified site: Secondary | ICD-10-CM | POA: Diagnosis not present

## 2024-10-23 DIAGNOSIS — F339 Major depressive disorder, recurrent, unspecified: Secondary | ICD-10-CM | POA: Diagnosis not present

## 2024-10-25 DIAGNOSIS — M1990 Unspecified osteoarthritis, unspecified site: Secondary | ICD-10-CM | POA: Diagnosis not present

## 2024-10-25 DIAGNOSIS — M6281 Muscle weakness (generalized): Secondary | ICD-10-CM | POA: Diagnosis not present

## 2024-10-25 DIAGNOSIS — F339 Major depressive disorder, recurrent, unspecified: Secondary | ICD-10-CM | POA: Diagnosis not present

## 2024-10-25 DIAGNOSIS — M24542 Contracture, left hand: Secondary | ICD-10-CM | POA: Diagnosis not present

## 2024-10-25 DIAGNOSIS — R1311 Dysphagia, oral phase: Secondary | ICD-10-CM | POA: Diagnosis not present

## 2024-10-26 DIAGNOSIS — M24542 Contracture, left hand: Secondary | ICD-10-CM | POA: Diagnosis not present

## 2024-10-26 DIAGNOSIS — M6281 Muscle weakness (generalized): Secondary | ICD-10-CM | POA: Diagnosis not present

## 2024-10-26 DIAGNOSIS — R1311 Dysphagia, oral phase: Secondary | ICD-10-CM | POA: Diagnosis not present

## 2024-10-26 DIAGNOSIS — F339 Major depressive disorder, recurrent, unspecified: Secondary | ICD-10-CM | POA: Diagnosis not present

## 2024-10-26 DIAGNOSIS — M1990 Unspecified osteoarthritis, unspecified site: Secondary | ICD-10-CM | POA: Diagnosis not present

## 2024-10-27 DIAGNOSIS — M24542 Contracture, left hand: Secondary | ICD-10-CM | POA: Diagnosis not present

## 2024-10-27 DIAGNOSIS — F339 Major depressive disorder, recurrent, unspecified: Secondary | ICD-10-CM | POA: Diagnosis not present

## 2024-10-27 DIAGNOSIS — R1311 Dysphagia, oral phase: Secondary | ICD-10-CM | POA: Diagnosis not present

## 2024-10-27 DIAGNOSIS — M1990 Unspecified osteoarthritis, unspecified site: Secondary | ICD-10-CM | POA: Diagnosis not present

## 2024-10-27 DIAGNOSIS — M6281 Muscle weakness (generalized): Secondary | ICD-10-CM | POA: Diagnosis not present

## 2024-10-28 DIAGNOSIS — M1990 Unspecified osteoarthritis, unspecified site: Secondary | ICD-10-CM | POA: Diagnosis not present

## 2024-10-28 DIAGNOSIS — M24542 Contracture, left hand: Secondary | ICD-10-CM | POA: Diagnosis not present

## 2024-10-28 DIAGNOSIS — R1311 Dysphagia, oral phase: Secondary | ICD-10-CM | POA: Diagnosis not present

## 2024-10-28 DIAGNOSIS — F339 Major depressive disorder, recurrent, unspecified: Secondary | ICD-10-CM | POA: Diagnosis not present

## 2024-10-28 DIAGNOSIS — M6281 Muscle weakness (generalized): Secondary | ICD-10-CM | POA: Diagnosis not present

## 2024-10-31 DIAGNOSIS — M1990 Unspecified osteoarthritis, unspecified site: Secondary | ICD-10-CM | POA: Diagnosis not present

## 2024-10-31 DIAGNOSIS — R1311 Dysphagia, oral phase: Secondary | ICD-10-CM | POA: Diagnosis not present

## 2024-10-31 DIAGNOSIS — M6281 Muscle weakness (generalized): Secondary | ICD-10-CM | POA: Diagnosis not present

## 2024-10-31 DIAGNOSIS — F339 Major depressive disorder, recurrent, unspecified: Secondary | ICD-10-CM | POA: Diagnosis not present

## 2024-11-01 DIAGNOSIS — F339 Major depressive disorder, recurrent, unspecified: Secondary | ICD-10-CM | POA: Diagnosis not present

## 2024-11-01 DIAGNOSIS — R1311 Dysphagia, oral phase: Secondary | ICD-10-CM | POA: Diagnosis not present

## 2024-11-01 DIAGNOSIS — M1990 Unspecified osteoarthritis, unspecified site: Secondary | ICD-10-CM | POA: Diagnosis not present

## 2024-11-01 DIAGNOSIS — F338 Other recurrent depressive disorders: Secondary | ICD-10-CM | POA: Diagnosis not present

## 2024-11-01 DIAGNOSIS — M6281 Muscle weakness (generalized): Secondary | ICD-10-CM | POA: Diagnosis not present

## 2024-11-01 DIAGNOSIS — M24542 Contracture, left hand: Secondary | ICD-10-CM | POA: Diagnosis not present

## 2024-11-01 DIAGNOSIS — F03C18 Unspecified dementia, severe, with other behavioral disturbance: Secondary | ICD-10-CM | POA: Diagnosis not present

## 2024-11-01 DIAGNOSIS — F411 Generalized anxiety disorder: Secondary | ICD-10-CM | POA: Diagnosis not present
